# Patient Record
Sex: Female | Born: 1960
Health system: Southern US, Community
[De-identification: ages and names within clinical notes are randomized; demographics above are authoritative.]

## PROBLEM LIST (undated history)

## (undated) DIAGNOSIS — Z8742 Personal history of other diseases of the female genital tract: Secondary | ICD-10-CM

## (undated) DIAGNOSIS — E538 Deficiency of other specified B group vitamins: Secondary | ICD-10-CM

## (undated) DIAGNOSIS — Z87442 Personal history of urinary calculi: Secondary | ICD-10-CM

## (undated) DIAGNOSIS — N200 Calculus of kidney: Secondary | ICD-10-CM

## (undated) HISTORY — PX: LEEP: SHX91

## (undated) HISTORY — DX: Calculus of kidney: N20.0

---

## 1997-11-10 ENCOUNTER — Other Ambulatory Visit: Admission: RE | Admit: 1997-11-10 | Discharge: 1997-11-10 | Payer: Self-pay | Admitting: Obstetrics and Gynecology

## 1998-06-19 HISTORY — PX: BREAST BIOPSY: SHX20

## 1998-11-05 ENCOUNTER — Other Ambulatory Visit: Admission: RE | Admit: 1998-11-05 | Discharge: 1998-11-05 | Payer: Self-pay | Admitting: Obstetrics and Gynecology

## 1999-11-22 ENCOUNTER — Other Ambulatory Visit: Admission: RE | Admit: 1999-11-22 | Discharge: 1999-11-22 | Payer: Self-pay | Admitting: Obstetrics and Gynecology

## 2000-11-12 ENCOUNTER — Other Ambulatory Visit: Admission: RE | Admit: 2000-11-12 | Discharge: 2000-11-12 | Payer: Self-pay | Admitting: Obstetrics and Gynecology

## 2001-11-28 ENCOUNTER — Other Ambulatory Visit: Admission: RE | Admit: 2001-11-28 | Discharge: 2001-11-28 | Payer: Self-pay | Admitting: Obstetrics and Gynecology

## 2004-12-14 ENCOUNTER — Ambulatory Visit: Payer: Self-pay | Admitting: Emergency Medicine

## 2005-02-15 ENCOUNTER — Ambulatory Visit: Payer: Self-pay | Admitting: Unknown Physician Specialty

## 2005-02-28 ENCOUNTER — Ambulatory Visit: Payer: Self-pay | Admitting: Unknown Physician Specialty

## 2005-08-23 ENCOUNTER — Ambulatory Visit: Payer: Self-pay | Admitting: General Surgery

## 2005-09-29 ENCOUNTER — Ambulatory Visit: Payer: Self-pay | Admitting: Urology

## 2006-08-28 ENCOUNTER — Ambulatory Visit: Payer: Self-pay | Admitting: Unknown Physician Specialty

## 2007-08-29 ENCOUNTER — Ambulatory Visit: Payer: Self-pay | Admitting: Unknown Physician Specialty

## 2008-09-11 ENCOUNTER — Ambulatory Visit: Payer: Self-pay | Admitting: Unknown Physician Specialty

## 2008-09-16 ENCOUNTER — Ambulatory Visit: Payer: Self-pay | Admitting: Unknown Physician Specialty

## 2009-09-16 ENCOUNTER — Ambulatory Visit: Payer: Self-pay | Admitting: Unknown Physician Specialty

## 2010-09-26 ENCOUNTER — Ambulatory Visit: Payer: Self-pay | Admitting: Unknown Physician Specialty

## 2011-06-27 ENCOUNTER — Ambulatory Visit: Payer: Self-pay | Admitting: Emergency Medicine

## 2011-06-27 ENCOUNTER — Other Ambulatory Visit: Payer: Self-pay | Admitting: Unknown Physician Specialty

## 2011-07-01 LAB — WOUND AEROBIC CULTURE

## 2011-10-09 ENCOUNTER — Ambulatory Visit: Payer: Self-pay | Admitting: Obstetrics and Gynecology

## 2011-10-16 ENCOUNTER — Ambulatory Visit: Payer: Self-pay | Admitting: Obstetrics and Gynecology

## 2012-02-26 ENCOUNTER — Ambulatory Visit: Payer: Self-pay | Admitting: General Practice

## 2012-02-26 LAB — IRON AND TIBC
Iron Saturation: 12 %
Iron: 47 ug/dL — ABNORMAL LOW (ref 50–170)

## 2012-02-26 LAB — CBC
HGB: 11.7 g/dL — ABNORMAL LOW (ref 12.0–16.0)
MCH: 29.9 pg (ref 26.0–34.0)
MCHC: 33 g/dL (ref 32.0–36.0)
Platelet: 304 10*3/uL (ref 150–440)
RDW: 13.5 % (ref 11.5–14.5)
WBC: 6.2 10*3/uL (ref 3.6–11.0)

## 2012-04-15 ENCOUNTER — Ambulatory Visit: Payer: Self-pay | Admitting: Obstetrics and Gynecology

## 2012-10-17 ENCOUNTER — Other Ambulatory Visit: Payer: Self-pay | Admitting: General Practice

## 2012-10-17 ENCOUNTER — Ambulatory Visit: Payer: Self-pay | Admitting: Obstetrics and Gynecology

## 2012-10-17 LAB — CBC WITH DIFFERENTIAL/PLATELET
Basophil #: 0.1 10*3/uL (ref 0.0–0.1)
Basophil %: 1.4 %
Eosinophil #: 0.1 10*3/uL (ref 0.0–0.7)
Eosinophil %: 2.3 %
HCT: 35.8 % (ref 35.0–47.0)
Lymphocyte #: 2.3 10*3/uL (ref 1.0–3.6)
MCHC: 34.1 g/dL (ref 32.0–36.0)
MCV: 89 fL (ref 80–100)
Monocyte #: 0.7 x10 3/mm (ref 0.2–0.9)
Neutrophil %: 46.8 %

## 2012-10-17 LAB — IRON AND TIBC
Iron: 53 ug/dL (ref 50–170)
Unbound Iron-Bind.Cap.: 360 ug/dL

## 2013-10-23 ENCOUNTER — Ambulatory Visit: Payer: Self-pay | Admitting: General Practice

## 2014-09-23 ENCOUNTER — Encounter: Payer: Self-pay | Admitting: *Deleted

## 2014-10-02 ENCOUNTER — Other Ambulatory Visit: Payer: Self-pay | Admitting: Internal Medicine

## 2014-10-02 DIAGNOSIS — Z1231 Encounter for screening mammogram for malignant neoplasm of breast: Secondary | ICD-10-CM

## 2014-10-26 ENCOUNTER — Ambulatory Visit
Admission: RE | Admit: 2014-10-26 | Discharge: 2014-10-26 | Disposition: A | Discharge status: Home / Self Care | Payer: 59 | Source: Ambulatory Visit | Attending: Internal Medicine | Admitting: Internal Medicine

## 2014-10-26 ENCOUNTER — Other Ambulatory Visit: Payer: Self-pay | Admitting: Internal Medicine

## 2014-10-26 DIAGNOSIS — Z1231 Encounter for screening mammogram for malignant neoplasm of breast: Secondary | ICD-10-CM

## 2015-07-21 DIAGNOSIS — R000 Tachycardia, unspecified: Secondary | ICD-10-CM | POA: Diagnosis not present

## 2015-07-21 DIAGNOSIS — E538 Deficiency of other specified B group vitamins: Secondary | ICD-10-CM | POA: Diagnosis not present

## 2015-07-28 DIAGNOSIS — Z1211 Encounter for screening for malignant neoplasm of colon: Secondary | ICD-10-CM | POA: Diagnosis not present

## 2015-07-28 DIAGNOSIS — R000 Tachycardia, unspecified: Secondary | ICD-10-CM | POA: Diagnosis not present

## 2015-07-28 DIAGNOSIS — N200 Calculus of kidney: Secondary | ICD-10-CM | POA: Diagnosis not present

## 2015-07-28 DIAGNOSIS — D519 Vitamin B12 deficiency anemia, unspecified: Secondary | ICD-10-CM | POA: Diagnosis not present

## 2015-09-16 ENCOUNTER — Other Ambulatory Visit: Payer: Self-pay | Admitting: Internal Medicine

## 2015-09-16 DIAGNOSIS — Z1231 Encounter for screening mammogram for malignant neoplasm of breast: Secondary | ICD-10-CM

## 2015-10-27 ENCOUNTER — Other Ambulatory Visit: Payer: Self-pay | Admitting: Internal Medicine

## 2015-10-27 ENCOUNTER — Ambulatory Visit
Admission: RE | Admit: 2015-10-27 | Discharge: 2015-10-27 | Disposition: A | Discharge status: Home / Self Care | Payer: 59 | Source: Ambulatory Visit | Attending: Internal Medicine | Admitting: Internal Medicine

## 2015-10-27 DIAGNOSIS — Z1231 Encounter for screening mammogram for malignant neoplasm of breast: Secondary | ICD-10-CM | POA: Diagnosis not present

## 2015-12-22 ENCOUNTER — Encounter: Payer: Self-pay | Admitting: Physician Assistant

## 2015-12-22 ENCOUNTER — Ambulatory Visit: Payer: Self-pay | Admitting: Physician Assistant

## 2015-12-22 VITALS — BP 94/60 | HR 60 | Temp 98.7°F

## 2015-12-22 DIAGNOSIS — R300 Dysuria: Secondary | ICD-10-CM

## 2015-12-22 DIAGNOSIS — N390 Urinary tract infection, site not specified: Secondary | ICD-10-CM

## 2015-12-22 DIAGNOSIS — R319 Hematuria, unspecified: Secondary | ICD-10-CM

## 2015-12-22 MED ORDER — CIPROFLOXACIN HCL 500 MG PO TABS: 500.0000 mg | ORAL_TABLET | Freq: Two times a day (BID) | ORAL | Status: DC

## 2015-12-22 MED ORDER — PHENAZOPYRIDINE HCL 200 MG PO TABS: 200.0000 mg | ORAL_TABLET | Freq: Three times a day (TID) | ORAL | Status: DC | PRN

## 2015-12-22 NOTE — Progress Notes (Signed)
S:  C/o uti sx for 2 days, pressure, urgency, frequency, denies vaginal discharge, abdominal pain or flank pain:  Remainder ros neg, sx started on Mon night, had cipro left from travel meds, took 2 pills  O:  Vitals wnl, nad, neuro intact, ua trace blood  A: uti  P: cipro 500mg  bid x 3d, pyridium 200mg  , increase water intake, add cranberry juice, return if not improving in 2 -3 days, return earlier if worsening, discussed pyelonephritis sx

## 2015-12-27 ENCOUNTER — Ambulatory Visit: Payer: Self-pay | Admitting: Physician Assistant

## 2015-12-28 ENCOUNTER — Ambulatory Visit: Payer: Self-pay | Admitting: Physician Assistant

## 2015-12-28 VITALS — BP 110/70 | HR 60 | Temp 97.7°F

## 2015-12-28 DIAGNOSIS — F5102 Adjustment insomnia: Secondary | ICD-10-CM

## 2015-12-28 DIAGNOSIS — F411 Generalized anxiety disorder: Secondary | ICD-10-CM

## 2015-12-28 DIAGNOSIS — T733XXA Exhaustion due to excessive exertion, initial encounter: Secondary | ICD-10-CM

## 2015-12-28 NOTE — Progress Notes (Signed)
S:  Insomnia, fatigue, stress, emotional, crying,  inability to focus due to personal stress of recent mother and husband's illness.   O: Lungs cl, Heart RR A: Insomnia, anxiety, fatigue P: [employer] papers filled out today for continuous time 4-6 weeks.  To be determined if more time is needed.

## 2016-01-04 ENCOUNTER — Ambulatory Visit: Payer: Self-pay | Admitting: Physician Assistant

## 2016-01-04 DIAGNOSIS — F5102 Adjustment insomnia: Secondary | ICD-10-CM

## 2016-01-04 NOTE — Progress Notes (Signed)
S: needs short term disability papers filled out O: no change A: insomnia due to stress, fatigue P: Papers filled out and faxed.  Encouraged to see EPA.

## 2016-01-11 ENCOUNTER — Encounter: Payer: Self-pay | Admitting: Physician Assistant

## 2016-01-11 ENCOUNTER — Ambulatory Visit: Payer: Self-pay | Admitting: Physician Assistant

## 2016-01-11 VITALS — BP 120/66 | Temp 97.6°F

## 2016-01-11 DIAGNOSIS — F4321 Adjustment disorder with depressed mood: Secondary | ICD-10-CM

## 2016-01-11 DIAGNOSIS — F5102 Adjustment insomnia: Secondary | ICD-10-CM

## 2016-01-11 NOTE — Progress Notes (Signed)
S: here for recheck of anxiety, insomnia and fatigue due to stress of husband's sudden illness and death, she has lost 6 lbs, unable to eat, crying a lot at home, overwhelmed by paper work and events of his sudden death, also her mother is once again having to go through radiation for breast cancer, worried about her, states she is taking "extra large" doses of benadryl to help her sleep, doesn't want to go on medications as she hopes this will be short term situation, no si/hi  O: vitals wnl, nad, tearful, anxious during conversation, neuro intact  A: situational depression, anxiety, insomnia  P: pt to continue benadryl as this is helping with her sleep, if stops working trial of melatonin, encouraged her to see EAP, stay involved with family/friends, ask for help to decrease feelings of being overwhelmed, recommend she stay out of work for at least another week to see if the symptoms decrease by getting back into a regular sleep pattern and schedule, 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

## 2016-01-31 ENCOUNTER — Ambulatory Visit: Payer: Self-pay | Admitting: Physician Assistant

## 2016-02-16 DIAGNOSIS — H5203 Hypermetropia, bilateral: Secondary | ICD-10-CM | POA: Diagnosis not present

## 2016-02-16 DIAGNOSIS — H52223 Regular astigmatism, bilateral: Secondary | ICD-10-CM | POA: Diagnosis not present

## 2016-02-16 DIAGNOSIS — H524 Presbyopia: Secondary | ICD-10-CM | POA: Diagnosis not present

## 2016-06-19 HISTORY — PX: BREAST EXCISIONAL BIOPSY: SUR124

## 2016-08-10 ENCOUNTER — Other Ambulatory Visit: Payer: Self-pay | Admitting: Obstetrics and Gynecology

## 2016-08-10 DIAGNOSIS — Z1231 Encounter for screening mammogram for malignant neoplasm of breast: Secondary | ICD-10-CM | POA: Diagnosis not present

## 2016-08-10 DIAGNOSIS — Z01419 Encounter for gynecological examination (general) (routine) without abnormal findings: Secondary | ICD-10-CM | POA: Diagnosis not present

## 2016-08-28 DIAGNOSIS — D519 Vitamin B12 deficiency anemia, unspecified: Secondary | ICD-10-CM | POA: Diagnosis not present

## 2016-08-28 DIAGNOSIS — Z0000 Encounter for general adult medical examination without abnormal findings: Secondary | ICD-10-CM | POA: Diagnosis not present

## 2016-09-04 DIAGNOSIS — D225 Melanocytic nevi of trunk: Secondary | ICD-10-CM | POA: Diagnosis not present

## 2016-09-04 DIAGNOSIS — L851 Acquired keratosis [keratoderma] palmaris et plantaris: Secondary | ICD-10-CM | POA: Diagnosis not present

## 2016-09-04 DIAGNOSIS — D2272 Melanocytic nevi of left lower limb, including hip: Secondary | ICD-10-CM | POA: Diagnosis not present

## 2016-09-04 DIAGNOSIS — D2261 Melanocytic nevi of right upper limb, including shoulder: Secondary | ICD-10-CM | POA: Diagnosis not present

## 2016-09-11 ENCOUNTER — Other Ambulatory Visit: Payer: Self-pay | Admitting: Internal Medicine

## 2016-09-11 DIAGNOSIS — R000 Tachycardia, unspecified: Secondary | ICD-10-CM | POA: Diagnosis not present

## 2016-09-11 DIAGNOSIS — R3129 Other microscopic hematuria: Secondary | ICD-10-CM | POA: Diagnosis not present

## 2016-09-11 DIAGNOSIS — D519 Vitamin B12 deficiency anemia, unspecified: Secondary | ICD-10-CM | POA: Diagnosis not present

## 2016-09-11 DIAGNOSIS — Z1231 Encounter for screening mammogram for malignant neoplasm of breast: Secondary | ICD-10-CM | POA: Diagnosis not present

## 2016-09-11 DIAGNOSIS — N200 Calculus of kidney: Secondary | ICD-10-CM | POA: Diagnosis not present

## 2016-09-14 DIAGNOSIS — Z1212 Encounter for screening for malignant neoplasm of rectum: Secondary | ICD-10-CM | POA: Diagnosis not present

## 2016-09-14 DIAGNOSIS — Z1211 Encounter for screening for malignant neoplasm of colon: Secondary | ICD-10-CM | POA: Diagnosis not present

## 2016-10-25 ENCOUNTER — Ambulatory Visit: Payer: Self-pay | Admitting: Physician Assistant

## 2016-10-25 ENCOUNTER — Encounter: Payer: Self-pay | Admitting: Physician Assistant

## 2016-10-25 VITALS — BP 110/70 | HR 60 | Temp 98.5°F

## 2016-10-25 DIAGNOSIS — M62838 Other muscle spasm: Secondary | ICD-10-CM

## 2016-10-25 MED ORDER — BACLOFEN 10 MG PO TABS: 10.0000 mg | ORAL_TABLET | Freq: Three times a day (TID) | ORAL | 0 refills | Status: DC

## 2016-10-25 MED ORDER — MELOXICAM 15 MG PO TABS: 15.0000 mg | ORAL_TABLET | Freq: Every day | ORAL | 0 refills | Status: DC

## 2016-10-25 NOTE — Progress Notes (Signed)
S: c/o r shoulder and neck pain, increased pain with movement, no numbness or tingling, been taking ibuprofen and aleve without relief, no known injury, just feels like she slept wrong  O: vitals wnl, nad, skin intact, cspine nontender, full rom, r trapezious and supraspinatus muscles spasmed, carotids neg for bruits, lungs c t a, cv rrr, n/v intact  A: muscle spasm  P: baclofen, mobic

## 2016-10-30 ENCOUNTER — Ambulatory Visit
Admission: RE | Admit: 2016-10-30 | Discharge: 2016-10-30 | Disposition: A | Discharge status: Home / Self Care | Payer: 59 | Source: Ambulatory Visit | Attending: Obstetrics and Gynecology | Admitting: Obstetrics and Gynecology

## 2016-10-30 DIAGNOSIS — Z1231 Encounter for screening mammogram for malignant neoplasm of breast: Secondary | ICD-10-CM | POA: Diagnosis not present

## 2016-11-07 ENCOUNTER — Other Ambulatory Visit: Payer: Self-pay | Admitting: Obstetrics and Gynecology

## 2016-11-07 DIAGNOSIS — R921 Mammographic calcification found on diagnostic imaging of breast: Secondary | ICD-10-CM

## 2016-11-07 DIAGNOSIS — R928 Other abnormal and inconclusive findings on diagnostic imaging of breast: Secondary | ICD-10-CM

## 2016-11-21 ENCOUNTER — Ambulatory Visit: Payer: 59

## 2016-12-01 ENCOUNTER — Ambulatory Visit
Admission: RE | Admit: 2016-12-01 | Discharge: 2016-12-01 | Disposition: A | Discharge status: Home / Self Care | Payer: 59 | Source: Ambulatory Visit | Attending: Obstetrics and Gynecology | Admitting: Obstetrics and Gynecology

## 2016-12-01 ENCOUNTER — Telehealth: Payer: Self-pay

## 2016-12-01 ENCOUNTER — Other Ambulatory Visit: Payer: Self-pay

## 2016-12-01 ENCOUNTER — Other Ambulatory Visit: Payer: Self-pay | Admitting: General Surgery

## 2016-12-01 DIAGNOSIS — R921 Mammographic calcification found on diagnostic imaging of breast: Secondary | ICD-10-CM

## 2016-12-01 DIAGNOSIS — R928 Other abnormal and inconclusive findings on diagnostic imaging of breast: Secondary | ICD-10-CM | POA: Diagnosis not present

## 2016-12-01 NOTE — Telephone Encounter (Signed)
Call to patient to discuss stereo biopsy. Patient has been scheduled for a left breast stereotactic biopsy at [facility] for 12/04/16 at 1:00 pm. She will check-in at the [facility] at 12:30 pm. This patient is aware of date, time, and instructions. Patient verbalizes understanding. She will follow up with a nurse visit here on 12/01/16 at 10:00 am.

## 2016-12-04 ENCOUNTER — Ambulatory Visit
Admission: RE | Admit: 2016-12-04 | Discharge: 2016-12-04 | Disposition: A | Discharge status: Home / Self Care | Payer: 59 | Source: Ambulatory Visit | Attending: General Surgery | Admitting: General Surgery

## 2016-12-04 DIAGNOSIS — R921 Mammographic calcification found on diagnostic imaging of breast: Secondary | ICD-10-CM | POA: Diagnosis not present

## 2016-12-04 DIAGNOSIS — R920 Mammographic microcalcification found on diagnostic imaging of breast: Secondary | ICD-10-CM | POA: Diagnosis not present

## 2016-12-05 LAB — SURGICAL PATHOLOGY

## 2016-12-11 ENCOUNTER — Ambulatory Visit (INDEPENDENT_AMBULATORY_CARE_PROVIDER_SITE_OTHER): Payer: 59 | Admitting: *Deleted

## 2016-12-11 DIAGNOSIS — N6320 Unspecified lump in the left breast, unspecified quadrant: Secondary | ICD-10-CM

## 2016-12-11 NOTE — Progress Notes (Signed)
Patient here today for follow up post left breast biopsy.  Steristrip in place and aware it may come off in one week. No bruising noticed. The patient is aware that a heating pad may be used for comfort as needed.  Follow up as scheduled.

## 2016-12-11 NOTE — Patient Instructions (Signed)
Follow up appointment to be announced.  

## 2017-02-21 DIAGNOSIS — H524 Presbyopia: Secondary | ICD-10-CM | POA: Diagnosis not present

## 2017-02-21 DIAGNOSIS — H52223 Regular astigmatism, bilateral: Secondary | ICD-10-CM | POA: Diagnosis not present

## 2017-02-21 DIAGNOSIS — H5203 Hypermetropia, bilateral: Secondary | ICD-10-CM | POA: Diagnosis not present

## 2017-05-24 DIAGNOSIS — N930 Postcoital and contact bleeding: Secondary | ICD-10-CM | POA: Diagnosis not present

## 2017-05-24 DIAGNOSIS — N950 Postmenopausal bleeding: Secondary | ICD-10-CM | POA: Diagnosis not present

## 2017-08-14 DIAGNOSIS — Z01419 Encounter for gynecological examination (general) (routine) without abnormal findings: Secondary | ICD-10-CM | POA: Diagnosis not present

## 2017-09-06 DIAGNOSIS — D519 Vitamin B12 deficiency anemia, unspecified: Secondary | ICD-10-CM | POA: Diagnosis not present

## 2017-09-06 DIAGNOSIS — Z0000 Encounter for general adult medical examination without abnormal findings: Secondary | ICD-10-CM | POA: Diagnosis not present

## 2017-09-06 DIAGNOSIS — R3129 Other microscopic hematuria: Secondary | ICD-10-CM | POA: Diagnosis not present

## 2017-09-27 ENCOUNTER — Other Ambulatory Visit: Payer: Self-pay | Admitting: Internal Medicine

## 2017-09-27 DIAGNOSIS — R002 Palpitations: Secondary | ICD-10-CM | POA: Diagnosis not present

## 2017-09-27 DIAGNOSIS — D519 Vitamin B12 deficiency anemia, unspecified: Secondary | ICD-10-CM | POA: Diagnosis not present

## 2017-09-27 DIAGNOSIS — Z0000 Encounter for general adult medical examination without abnormal findings: Secondary | ICD-10-CM | POA: Diagnosis not present

## 2017-09-27 DIAGNOSIS — Z1231 Encounter for screening mammogram for malignant neoplasm of breast: Secondary | ICD-10-CM

## 2017-10-31 ENCOUNTER — Encounter: Payer: Self-pay | Admitting: Radiology

## 2017-10-31 ENCOUNTER — Ambulatory Visit
Admission: RE | Admit: 2017-10-31 | Discharge: 2017-10-31 | Disposition: A | Discharge status: Home / Self Care | Payer: 59 | Source: Ambulatory Visit | Attending: Internal Medicine | Admitting: Internal Medicine

## 2017-10-31 DIAGNOSIS — Z1231 Encounter for screening mammogram for malignant neoplasm of breast: Secondary | ICD-10-CM | POA: Diagnosis not present

## 2018-02-25 DIAGNOSIS — H52223 Regular astigmatism, bilateral: Secondary | ICD-10-CM | POA: Diagnosis not present

## 2018-02-25 DIAGNOSIS — H524 Presbyopia: Secondary | ICD-10-CM | POA: Diagnosis not present

## 2018-02-25 DIAGNOSIS — H5203 Hypermetropia, bilateral: Secondary | ICD-10-CM | POA: Diagnosis not present

## 2018-07-05 DIAGNOSIS — R55 Syncope and collapse: Secondary | ICD-10-CM | POA: Diagnosis not present

## 2018-07-05 DIAGNOSIS — R238 Other skin changes: Secondary | ICD-10-CM | POA: Diagnosis not present

## 2018-07-05 DIAGNOSIS — I959 Hypotension, unspecified: Secondary | ICD-10-CM | POA: Diagnosis not present

## 2018-07-16 DIAGNOSIS — R55 Syncope and collapse: Secondary | ICD-10-CM | POA: Diagnosis not present

## 2018-07-17 DIAGNOSIS — R748 Abnormal levels of other serum enzymes: Secondary | ICD-10-CM | POA: Diagnosis not present

## 2018-07-17 DIAGNOSIS — R55 Syncope and collapse: Secondary | ICD-10-CM | POA: Diagnosis not present

## 2018-07-17 DIAGNOSIS — I959 Hypotension, unspecified: Secondary | ICD-10-CM | POA: Diagnosis not present

## 2018-07-18 DIAGNOSIS — N23 Unspecified renal colic: Secondary | ICD-10-CM | POA: Diagnosis not present

## 2018-07-18 DIAGNOSIS — I959 Hypotension, unspecified: Secondary | ICD-10-CM | POA: Diagnosis not present

## 2018-07-18 DIAGNOSIS — R748 Abnormal levels of other serum enzymes: Secondary | ICD-10-CM | POA: Diagnosis not present

## 2018-07-18 DIAGNOSIS — R002 Palpitations: Secondary | ICD-10-CM | POA: Diagnosis not present

## 2018-07-18 DIAGNOSIS — R55 Syncope and collapse: Secondary | ICD-10-CM | POA: Diagnosis not present

## 2018-07-30 DIAGNOSIS — C44519 Basal cell carcinoma of skin of other part of trunk: Secondary | ICD-10-CM | POA: Diagnosis not present

## 2018-07-30 DIAGNOSIS — D2272 Melanocytic nevi of left lower limb, including hip: Secondary | ICD-10-CM | POA: Diagnosis not present

## 2018-07-30 DIAGNOSIS — D2271 Melanocytic nevi of right lower limb, including hip: Secondary | ICD-10-CM | POA: Diagnosis not present

## 2018-07-30 DIAGNOSIS — D485 Neoplasm of uncertain behavior of skin: Secondary | ICD-10-CM | POA: Diagnosis not present

## 2018-07-30 DIAGNOSIS — D225 Melanocytic nevi of trunk: Secondary | ICD-10-CM | POA: Diagnosis not present

## 2018-07-30 DIAGNOSIS — D2261 Melanocytic nevi of right upper limb, including shoulder: Secondary | ICD-10-CM | POA: Diagnosis not present

## 2018-07-30 DIAGNOSIS — D2262 Melanocytic nevi of left upper limb, including shoulder: Secondary | ICD-10-CM | POA: Diagnosis not present

## 2018-08-19 DIAGNOSIS — C44519 Basal cell carcinoma of skin of other part of trunk: Secondary | ICD-10-CM | POA: Diagnosis not present

## 2018-08-20 ENCOUNTER — Other Ambulatory Visit: Payer: Self-pay | Admitting: Obstetrics and Gynecology

## 2018-08-20 DIAGNOSIS — Z124 Encounter for screening for malignant neoplasm of cervix: Secondary | ICD-10-CM | POA: Diagnosis not present

## 2018-08-20 DIAGNOSIS — Z01419 Encounter for gynecological examination (general) (routine) without abnormal findings: Secondary | ICD-10-CM | POA: Diagnosis not present

## 2018-08-20 DIAGNOSIS — Z1231 Encounter for screening mammogram for malignant neoplasm of breast: Secondary | ICD-10-CM

## 2018-08-20 DIAGNOSIS — Z1151 Encounter for screening for human papillomavirus (HPV): Secondary | ICD-10-CM | POA: Diagnosis not present

## 2018-08-20 DIAGNOSIS — Z1239 Encounter for other screening for malignant neoplasm of breast: Secondary | ICD-10-CM | POA: Diagnosis not present

## 2018-09-30 DIAGNOSIS — D519 Vitamin B12 deficiency anemia, unspecified: Secondary | ICD-10-CM | POA: Diagnosis not present

## 2018-09-30 DIAGNOSIS — Z114 Encounter for screening for human immunodeficiency virus [HIV]: Secondary | ICD-10-CM | POA: Diagnosis not present

## 2018-09-30 DIAGNOSIS — Z0000 Encounter for general adult medical examination without abnormal findings: Secondary | ICD-10-CM | POA: Diagnosis not present

## 2018-10-03 DIAGNOSIS — Z0000 Encounter for general adult medical examination without abnormal findings: Secondary | ICD-10-CM | POA: Diagnosis not present

## 2018-10-03 DIAGNOSIS — D519 Vitamin B12 deficiency anemia, unspecified: Secondary | ICD-10-CM | POA: Diagnosis not present

## 2018-10-03 DIAGNOSIS — R748 Abnormal levels of other serum enzymes: Secondary | ICD-10-CM | POA: Diagnosis not present

## 2018-10-03 DIAGNOSIS — R002 Palpitations: Secondary | ICD-10-CM | POA: Diagnosis not present

## 2018-10-03 DIAGNOSIS — N200 Calculus of kidney: Secondary | ICD-10-CM | POA: Diagnosis not present

## 2018-10-14 DIAGNOSIS — Z1211 Encounter for screening for malignant neoplasm of colon: Secondary | ICD-10-CM | POA: Diagnosis not present

## 2018-10-14 DIAGNOSIS — Z1212 Encounter for screening for malignant neoplasm of rectum: Secondary | ICD-10-CM | POA: Diagnosis not present

## 2018-10-21 ENCOUNTER — Other Ambulatory Visit: Payer: Self-pay | Admitting: Internal Medicine

## 2018-10-21 DIAGNOSIS — D519 Vitamin B12 deficiency anemia, unspecified: Secondary | ICD-10-CM | POA: Diagnosis not present

## 2018-10-21 DIAGNOSIS — E7800 Pure hypercholesterolemia, unspecified: Secondary | ICD-10-CM | POA: Diagnosis not present

## 2018-10-21 DIAGNOSIS — R7989 Other specified abnormal findings of blood chemistry: Secondary | ICD-10-CM

## 2018-10-21 DIAGNOSIS — R945 Abnormal results of liver function studies: Principal | ICD-10-CM

## 2018-10-21 DIAGNOSIS — N200 Calculus of kidney: Secondary | ICD-10-CM | POA: Diagnosis not present

## 2018-10-22 ENCOUNTER — Ambulatory Visit: Payer: 59

## 2018-10-22 ENCOUNTER — Other Ambulatory Visit: Payer: Self-pay

## 2018-10-22 ENCOUNTER — Ambulatory Visit
Admission: RE | Admit: 2018-10-22 | Discharge: 2018-10-22 | Disposition: A | Discharge status: Home / Self Care | Payer: 59 | Source: Ambulatory Visit | Attending: Internal Medicine | Admitting: Internal Medicine

## 2018-10-22 DIAGNOSIS — K824 Cholesterolosis of gallbladder: Secondary | ICD-10-CM | POA: Diagnosis not present

## 2018-10-22 DIAGNOSIS — R7989 Other specified abnormal findings of blood chemistry: Secondary | ICD-10-CM

## 2018-10-22 DIAGNOSIS — R945 Abnormal results of liver function studies: Secondary | ICD-10-CM | POA: Insufficient documentation

## 2018-10-22 DIAGNOSIS — K828 Other specified diseases of gallbladder: Secondary | ICD-10-CM | POA: Diagnosis not present

## 2018-10-25 ENCOUNTER — Ambulatory Visit: Payer: 59

## 2018-10-25 ENCOUNTER — Other Ambulatory Visit: Payer: Self-pay | Admitting: Family Medicine

## 2018-10-25 ENCOUNTER — Ambulatory Visit
Admission: RE | Admit: 2018-10-25 | Discharge: 2018-10-25 | Disposition: A | Discharge status: Home / Self Care | Payer: 59 | Attending: Family Medicine | Admitting: Family Medicine

## 2018-10-25 ENCOUNTER — Other Ambulatory Visit: Payer: Self-pay

## 2018-10-25 ENCOUNTER — Ambulatory Visit
Admission: RE | Admit: 2018-10-25 | Discharge: 2018-10-25 | Disposition: A | Discharge status: Home / Self Care | Payer: PRIVATE HEALTH INSURANCE | Source: Ambulatory Visit | Attending: Family Medicine | Admitting: Family Medicine

## 2018-10-25 DIAGNOSIS — M25531 Pain in right wrist: Secondary | ICD-10-CM

## 2018-10-25 DIAGNOSIS — M79641 Pain in right hand: Secondary | ICD-10-CM | POA: Diagnosis not present

## 2018-10-29 DIAGNOSIS — R740 Nonspecific elevation of levels of transaminase and lactic acid dehydrogenase [LDH]: Secondary | ICD-10-CM | POA: Diagnosis not present

## 2018-10-29 DIAGNOSIS — D519 Vitamin B12 deficiency anemia, unspecified: Secondary | ICD-10-CM | POA: Diagnosis not present

## 2018-11-28 ENCOUNTER — Other Ambulatory Visit: Payer: Self-pay

## 2018-11-28 ENCOUNTER — Ambulatory Visit
Admission: RE | Admit: 2018-11-28 | Discharge: 2018-11-28 | Disposition: A | Discharge status: Home / Self Care | Payer: 59 | Source: Ambulatory Visit | Attending: Obstetrics and Gynecology | Admitting: Obstetrics and Gynecology

## 2018-11-28 DIAGNOSIS — Z1231 Encounter for screening mammogram for malignant neoplasm of breast: Secondary | ICD-10-CM | POA: Insufficient documentation

## 2018-12-03 ENCOUNTER — Other Ambulatory Visit: Payer: Self-pay | Admitting: Obstetrics and Gynecology

## 2018-12-03 DIAGNOSIS — R928 Other abnormal and inconclusive findings on diagnostic imaging of breast: Secondary | ICD-10-CM

## 2018-12-03 DIAGNOSIS — N6489 Other specified disorders of breast: Secondary | ICD-10-CM

## 2018-12-09 ENCOUNTER — Ambulatory Visit
Admission: RE | Admit: 2018-12-09 | Discharge: 2018-12-09 | Disposition: A | Discharge status: Home / Self Care | Payer: 59 | Source: Ambulatory Visit | Attending: Obstetrics and Gynecology | Admitting: Obstetrics and Gynecology

## 2018-12-09 ENCOUNTER — Other Ambulatory Visit: Payer: Self-pay

## 2018-12-09 DIAGNOSIS — R928 Other abnormal and inconclusive findings on diagnostic imaging of breast: Secondary | ICD-10-CM | POA: Diagnosis not present

## 2018-12-09 DIAGNOSIS — N6489 Other specified disorders of breast: Secondary | ICD-10-CM

## 2018-12-09 DIAGNOSIS — R922 Inconclusive mammogram: Secondary | ICD-10-CM | POA: Diagnosis not present

## 2018-12-12 DIAGNOSIS — R740 Nonspecific elevation of levels of transaminase and lactic acid dehydrogenase [LDH]: Secondary | ICD-10-CM | POA: Diagnosis not present

## 2018-12-17 ENCOUNTER — Ambulatory Visit: Payer: 59

## 2018-12-24 DIAGNOSIS — L538 Other specified erythematous conditions: Secondary | ICD-10-CM | POA: Diagnosis not present

## 2018-12-24 DIAGNOSIS — R208 Other disturbances of skin sensation: Secondary | ICD-10-CM | POA: Diagnosis not present

## 2018-12-24 DIAGNOSIS — D2262 Melanocytic nevi of left upper limb, including shoulder: Secondary | ICD-10-CM | POA: Diagnosis not present

## 2018-12-24 DIAGNOSIS — Z85828 Personal history of other malignant neoplasm of skin: Secondary | ICD-10-CM | POA: Diagnosis not present

## 2018-12-24 DIAGNOSIS — Z08 Encounter for follow-up examination after completed treatment for malignant neoplasm: Secondary | ICD-10-CM | POA: Diagnosis not present

## 2018-12-24 DIAGNOSIS — D225 Melanocytic nevi of trunk: Secondary | ICD-10-CM | POA: Diagnosis not present

## 2018-12-24 DIAGNOSIS — D2272 Melanocytic nevi of left lower limb, including hip: Secondary | ICD-10-CM | POA: Diagnosis not present

## 2018-12-24 DIAGNOSIS — D2261 Melanocytic nevi of right upper limb, including shoulder: Secondary | ICD-10-CM | POA: Diagnosis not present

## 2018-12-24 DIAGNOSIS — B078 Other viral warts: Secondary | ICD-10-CM | POA: Diagnosis not present

## 2018-12-24 DIAGNOSIS — R58 Hemorrhage, not elsewhere classified: Secondary | ICD-10-CM | POA: Diagnosis not present

## 2019-02-27 DIAGNOSIS — H52223 Regular astigmatism, bilateral: Secondary | ICD-10-CM | POA: Diagnosis not present

## 2019-02-27 DIAGNOSIS — H5203 Hypermetropia, bilateral: Secondary | ICD-10-CM | POA: Diagnosis not present

## 2019-02-27 DIAGNOSIS — H524 Presbyopia: Secondary | ICD-10-CM | POA: Diagnosis not present

## 2019-05-29 DIAGNOSIS — L820 Inflamed seborrheic keratosis: Secondary | ICD-10-CM | POA: Diagnosis not present

## 2019-05-29 DIAGNOSIS — L538 Other specified erythematous conditions: Secondary | ICD-10-CM | POA: Diagnosis not present

## 2019-08-21 ENCOUNTER — Other Ambulatory Visit: Payer: Self-pay | Admitting: Obstetrics and Gynecology

## 2019-08-21 DIAGNOSIS — Z1231 Encounter for screening mammogram for malignant neoplasm of breast: Secondary | ICD-10-CM

## 2019-08-21 DIAGNOSIS — Z01419 Encounter for gynecological examination (general) (routine) without abnormal findings: Secondary | ICD-10-CM | POA: Diagnosis not present

## 2019-09-14 ENCOUNTER — Ambulatory Visit: Payer: 59 | Attending: Internal Medicine

## 2019-09-14 DIAGNOSIS — Z23 Encounter for immunization: Secondary | ICD-10-CM

## 2019-09-14 NOTE — Progress Notes (Signed)
   Covid-19 Vaccination Clinic  Name:  Lindsey Sullivan    MRN: [id number] DOB: [birth year]  09/14/2019  [name] was observed post Covid-19 immunization for 15 minutes without incident. She was provided with Vaccine Information Sheet and instruction to access the V-Safe system.   [name] was instructed to call 911 with any severe reactions post vaccine: Marland Kitchen Difficulty breathing  . Swelling of face and throat  . A fast heartbeat  . A bad rash all over body  . Dizziness and weakness   Immunizations Administered    Name Date Dose VIS Date Route   Pfizer COVID-19 Vaccine 09/14/2019  2:46 PM 0.3 mL 05/30/2019 Intramuscular   Manufacturer: [place]   Lot: [id number]   [place]: [id number]

## 2019-09-29 DIAGNOSIS — X32XXXA Exposure to sunlight, initial encounter: Secondary | ICD-10-CM | POA: Diagnosis not present

## 2019-09-29 DIAGNOSIS — Z08 Encounter for follow-up examination after completed treatment for malignant neoplasm: Secondary | ICD-10-CM | POA: Diagnosis not present

## 2019-09-29 DIAGNOSIS — Z85828 Personal history of other malignant neoplasm of skin: Secondary | ICD-10-CM | POA: Diagnosis not present

## 2019-09-29 DIAGNOSIS — D2261 Melanocytic nevi of right upper limb, including shoulder: Secondary | ICD-10-CM | POA: Diagnosis not present

## 2019-09-29 DIAGNOSIS — D2271 Melanocytic nevi of right lower limb, including hip: Secondary | ICD-10-CM | POA: Diagnosis not present

## 2019-09-29 DIAGNOSIS — L814 Other melanin hyperpigmentation: Secondary | ICD-10-CM | POA: Diagnosis not present

## 2019-09-29 DIAGNOSIS — D2262 Melanocytic nevi of left upper limb, including shoulder: Secondary | ICD-10-CM | POA: Diagnosis not present

## 2019-10-01 DIAGNOSIS — D519 Vitamin B12 deficiency anemia, unspecified: Secondary | ICD-10-CM | POA: Diagnosis not present

## 2019-10-01 DIAGNOSIS — Z0000 Encounter for general adult medical examination without abnormal findings: Secondary | ICD-10-CM | POA: Diagnosis not present

## 2019-10-01 DIAGNOSIS — R000 Tachycardia, unspecified: Secondary | ICD-10-CM | POA: Diagnosis not present

## 2019-10-08 ENCOUNTER — Ambulatory Visit: Payer: 59 | Attending: Internal Medicine

## 2019-10-08 DIAGNOSIS — R002 Palpitations: Secondary | ICD-10-CM | POA: Diagnosis not present

## 2019-10-08 DIAGNOSIS — Z0000 Encounter for general adult medical examination without abnormal findings: Secondary | ICD-10-CM | POA: Diagnosis not present

## 2019-10-08 DIAGNOSIS — R000 Tachycardia, unspecified: Secondary | ICD-10-CM | POA: Diagnosis not present

## 2019-10-08 DIAGNOSIS — D519 Vitamin B12 deficiency anemia, unspecified: Secondary | ICD-10-CM | POA: Diagnosis not present

## 2019-10-08 DIAGNOSIS — Z23 Encounter for immunization: Secondary | ICD-10-CM

## 2019-10-08 NOTE — Progress Notes (Signed)
   Covid-19 Vaccination Clinic  Name:  Lindsey Sullivan    MRN: [id number] DOB: 02/10/1961  10/08/2019  [name] was observed post Covid-19 immunization for 15 minutes without incident. She was provided with Vaccine Information Sheet and instruction to access the V-Safe system.   [name] was instructed to call 911 with any severe reactions post vaccine: Marland Kitchen Difficulty breathing  . Swelling of face and throat  . A fast heartbeat  . A bad rash all over body  . Dizziness and weakness   Immunizations Administered    Name Date Dose VIS Date Route   Pfizer COVID-19 Vaccine 10/08/2019 11:15 AM 0.3 mL 08/13/2018 Intramuscular   Manufacturer: [employer]   Lot: [id number]   [place]: [id number]

## 2019-12-01 ENCOUNTER — Ambulatory Visit
Admission: RE | Admit: 2019-12-01 | Discharge: 2019-12-01 | Disposition: A | Discharge status: Home / Self Care | Payer: 59 | Source: Ambulatory Visit | Attending: Obstetrics and Gynecology | Admitting: Obstetrics and Gynecology

## 2019-12-01 DIAGNOSIS — Z1231 Encounter for screening mammogram for malignant neoplasm of breast: Secondary | ICD-10-CM | POA: Insufficient documentation

## 2020-03-02 DIAGNOSIS — H5203 Hypermetropia, bilateral: Secondary | ICD-10-CM | POA: Diagnosis not present

## 2020-03-02 DIAGNOSIS — Z135 Encounter for screening for eye and ear disorders: Secondary | ICD-10-CM | POA: Diagnosis not present

## 2020-03-02 DIAGNOSIS — H524 Presbyopia: Secondary | ICD-10-CM | POA: Diagnosis not present

## 2020-03-02 DIAGNOSIS — H52223 Regular astigmatism, bilateral: Secondary | ICD-10-CM | POA: Diagnosis not present

## 2020-07-20 ENCOUNTER — Other Ambulatory Visit: Payer: Self-pay | Admitting: Physician Assistant

## 2020-09-28 DIAGNOSIS — D2272 Melanocytic nevi of left lower limb, including hip: Secondary | ICD-10-CM | POA: Diagnosis not present

## 2020-09-28 DIAGNOSIS — L570 Actinic keratosis: Secondary | ICD-10-CM | POA: Diagnosis not present

## 2020-09-28 DIAGNOSIS — Z85828 Personal history of other malignant neoplasm of skin: Secondary | ICD-10-CM | POA: Diagnosis not present

## 2020-09-28 DIAGNOSIS — X32XXXA Exposure to sunlight, initial encounter: Secondary | ICD-10-CM | POA: Diagnosis not present

## 2020-09-28 DIAGNOSIS — D2262 Melanocytic nevi of left upper limb, including shoulder: Secondary | ICD-10-CM | POA: Diagnosis not present

## 2020-09-28 DIAGNOSIS — D2261 Melanocytic nevi of right upper limb, including shoulder: Secondary | ICD-10-CM | POA: Diagnosis not present

## 2020-09-28 DIAGNOSIS — L538 Other specified erythematous conditions: Secondary | ICD-10-CM | POA: Diagnosis not present

## 2020-09-28 DIAGNOSIS — L820 Inflamed seborrheic keratosis: Secondary | ICD-10-CM | POA: Diagnosis not present

## 2020-09-29 ENCOUNTER — Other Ambulatory Visit: Payer: Self-pay | Admitting: Obstetrics and Gynecology

## 2020-09-29 DIAGNOSIS — Z1231 Encounter for screening mammogram for malignant neoplasm of breast: Secondary | ICD-10-CM

## 2020-09-29 DIAGNOSIS — Z01419 Encounter for gynecological examination (general) (routine) without abnormal findings: Secondary | ICD-10-CM | POA: Diagnosis not present

## 2020-10-06 DIAGNOSIS — Z114 Encounter for screening for human immunodeficiency virus [HIV]: Secondary | ICD-10-CM | POA: Diagnosis not present

## 2020-10-06 DIAGNOSIS — Z0000 Encounter for general adult medical examination without abnormal findings: Secondary | ICD-10-CM | POA: Diagnosis not present

## 2020-10-06 DIAGNOSIS — Z13220 Encounter for screening for lipoid disorders: Secondary | ICD-10-CM | POA: Diagnosis not present

## 2020-10-06 DIAGNOSIS — D519 Vitamin B12 deficiency anemia, unspecified: Secondary | ICD-10-CM | POA: Diagnosis not present

## 2020-10-13 DIAGNOSIS — N200 Calculus of kidney: Secondary | ICD-10-CM | POA: Diagnosis not present

## 2020-10-13 DIAGNOSIS — D519 Vitamin B12 deficiency anemia, unspecified: Secondary | ICD-10-CM | POA: Diagnosis not present

## 2020-10-13 DIAGNOSIS — R002 Palpitations: Secondary | ICD-10-CM | POA: Diagnosis not present

## 2020-10-13 DIAGNOSIS — Z0000 Encounter for general adult medical examination without abnormal findings: Secondary | ICD-10-CM | POA: Diagnosis not present

## 2020-12-01 ENCOUNTER — Ambulatory Visit
Admission: RE | Admit: 2020-12-01 | Discharge: 2020-12-01 | Disposition: A | Discharge status: Home / Self Care | Payer: 59 | Source: Ambulatory Visit | Attending: Obstetrics and Gynecology | Admitting: Obstetrics and Gynecology

## 2020-12-01 ENCOUNTER — Other Ambulatory Visit: Payer: Self-pay

## 2020-12-01 DIAGNOSIS — Z1231 Encounter for screening mammogram for malignant neoplasm of breast: Secondary | ICD-10-CM | POA: Diagnosis not present

## 2021-03-07 DIAGNOSIS — H524 Presbyopia: Secondary | ICD-10-CM | POA: Diagnosis not present

## 2021-03-07 DIAGNOSIS — H5203 Hypermetropia, bilateral: Secondary | ICD-10-CM | POA: Diagnosis not present

## 2021-03-07 DIAGNOSIS — Z135 Encounter for screening for eye and ear disorders: Secondary | ICD-10-CM | POA: Diagnosis not present

## 2021-03-07 DIAGNOSIS — H52223 Regular astigmatism, bilateral: Secondary | ICD-10-CM | POA: Diagnosis not present

## 2021-04-27 ENCOUNTER — Other Ambulatory Visit: Payer: Self-pay

## 2021-04-27 MED ORDER — SULFAMETHOXAZOLE-TRIMETHOPRIM 800-160 MG PO TABS
ORAL_TABLET | ORAL | 0 refills | Status: DC
  Filled 2021-04-27: qty 14, 7d supply, fill #0

## 2021-04-27 MED ORDER — CEPHALEXIN 500 MG PO CAPS
500.0000 mg | ORAL_CAPSULE | ORAL | 0 refills | Status: DC
  Filled 2021-04-27: qty 21, 7d supply, fill #0

## 2021-04-28 ENCOUNTER — Other Ambulatory Visit: Payer: Self-pay

## 2021-04-28 ENCOUNTER — Encounter: Payer: Self-pay | Admitting: Emergency Medicine

## 2021-04-28 ENCOUNTER — Emergency Department
Admission: EM | Admit: 2021-04-28 | Discharge: 2021-04-29 | Disposition: A | Discharge status: Home / Self Care | Payer: 59 | Attending: Emergency Medicine | Admitting: Emergency Medicine

## 2021-04-28 DIAGNOSIS — L03211 Cellulitis of face: Secondary | ICD-10-CM | POA: Diagnosis not present

## 2021-04-28 LAB — CBC WITH DIFFERENTIAL/PLATELET
Abs Immature Granulocytes: 0.03 10*3/uL (ref 0.00–0.07)
Basophils Absolute: 0.1 10*3/uL (ref 0.0–0.1)
Basophils Relative: 1 %
Eosinophils Absolute: 0.2 10*3/uL (ref 0.0–0.5)
Eosinophils Relative: 2 %
HCT: 38.4 % (ref 36.0–46.0)
Hemoglobin: 12.7 g/dL (ref 12.0–15.0)
Immature Granulocytes: 0 %
Lymphocytes Relative: 24 %
Lymphs Abs: 2 10*3/uL (ref 0.7–4.0)
MCH: 29.9 pg (ref 26.0–34.0)
MCHC: 33.1 g/dL (ref 30.0–36.0)
MCV: 90.4 fL (ref 80.0–100.0)
Monocytes Absolute: 0.8 10*3/uL (ref 0.1–1.0)
Monocytes Relative: 10 %
Neutro Abs: 5.2 10*3/uL (ref 1.7–7.7)
Neutrophils Relative %: 63 %
Platelets: 399 10*3/uL (ref 150–400)
RBC: 4.25 MIL/uL (ref 3.87–5.11)
RDW: 13.5 % (ref 11.5–15.5)
WBC: 8.3 10*3/uL (ref 4.0–10.5)
nRBC: 0 % (ref 0.0–0.2)

## 2021-04-28 LAB — COMPREHENSIVE METABOLIC PANEL
ALT: 21 U/L (ref 0–44)
AST: 37 U/L (ref 15–41)
Albumin: 4.3 g/dL (ref 3.5–5.0)
Alkaline Phosphatase: 73 U/L (ref 38–126)
Anion gap: 8 (ref 5–15)
BUN: 8 mg/dL (ref 6–20)
CO2: 24 mmol/L (ref 22–32)
Calcium: 9 mg/dL (ref 8.9–10.3)
Chloride: 103 mmol/L (ref 98–111)
Creatinine, Ser: 0.9 mg/dL (ref 0.44–1.00)
GFR, Estimated: >60 mL/min (ref >60)
Glucose, Bld: 109 mg/dL — ABNORMAL HIGH (ref 70–99)
Potassium: 3.5 mmol/L (ref 3.5–5.1)
Sodium: 135 mmol/L (ref 135–145)
Total Bilirubin: 0.7 mg/dL (ref 0.3–1.2)
Total Protein: 7.4 g/dL (ref 6.5–8.1)

## 2021-04-28 MED ORDER — MUPIROCIN 2 % EX OINT
TOPICAL_OINTMENT | CUTANEOUS | 2 refills | Status: DC
  Filled 2021-04-28: qty 22, 7d supply, fill #0

## 2021-04-28 MED ORDER — DEXAMETHASONE SODIUM PHOSPHATE 10 MG/ML IJ SOLN
10.0000 mg | Freq: Once | INTRAMUSCULAR | Status: AC
  Administered 2021-04-28: 10 mg via INTRAVENOUS
  Filled 2021-04-28: qty 1

## 2021-04-28 MED ORDER — VANCOMYCIN HCL IN DEXTROSE 1-5 GM/200ML-% IV SOLN
1000.0000 mg | Freq: Once | INTRAVENOUS | Status: AC
  Administered 2021-04-28: 1000 mg via INTRAVENOUS
  Filled 2021-04-28: qty 200

## 2021-04-28 NOTE — ED Triage Notes (Signed)
Pt with facial pain and swelling.  Airway intact, maintaining secretions, in NAD at this time.  See provider note for more info.

## 2021-04-28 NOTE — ED Provider Notes (Signed)
[facility]-EMERGENCY DEPARTMENT  ____________________________________________  Time seen: Approximately 11:27 PM  I have reviewed the triage vital signs and the nursing notes.   HISTORY  Chief Complaint Facial Pain   Historian Patient    HPI Lindsey Sullivan is a 60 y.o. female with a history of MRSA presents to the emergency department with concern for cellulitis of the nose and upper lip likely from nasal vestibular furunculosis.  Patient has had 2 IM injections of Rocephin and has had 4 doses of Bactrim and 3 doses of Keflex over the course of 2 days. Patient also reports that she took 2-3 doses of Ciprofloxacin. She is concerned that upper lip feels more indurated and seems more edematous today.  She has had no fever but has had less energy today and has had some nausea.  She reports that she had facial cellulitis 7 years ago in a similar location that responded well to vancomycin and steroids.   Past Medical History:  Diagnosis Date   Kidney stone      Immunizations up to date:  Yes.     Past Medical History:  Diagnosis Date   Kidney stone     There are no problems to display for this patient.   Past Surgical History:  Procedure Laterality Date   BREAST BIOPSY Right 2000   negative   BREAST EXCISIONAL BIOPSY Left 2018   benign   LEEP      Prior to Admission medications   Medication Sig Start Date End Date Taking? Authorizing Provider  cephALEXin (KEFLEX) 500 MG capsule Take one capsule by mouth 3 times a day for 7 days 04/27/21   Caryn Section, Linden Dolin, PA-C  ciprofloxacin (CIPRO) 500 MG tablet TAKE 1 TABLET BY MOUTH 2 TIMES DAILY FOR 7 DAYS 07/20/20 07/20/21  Caryn Section, Linden Dolin, PA-C  mupirocin ointment (BACTROBAN) 2 % Apply to left nostril 3 times a day for 10 days 04/27/21   Cuthriell, Charline Bills, PA-C  naproxen sodium (ANAPROX) 220 MG tablet Take 220 mg by mouth as needed.    [provider]  phenazopyridine (PYRIDIUM) 200 MG tablet TAKE 1 TABLET BY MOUTH 3 TIMES  DAILY AS NEEDED 07/20/20 07/20/21  Caryn Section Linden Dolin, PA-C  sulfamethoxazole-trimethoprim (BACTRIM DS) 800-160 MG tablet Take one tablet by mouth twice a day for 7 days 04/27/21   Caryn Section Linden Dolin, PA-C    Allergies Diflucan [fluconazole]  Family History  Problem Relation Age of Onset   Breast cancer Mother 82       twice    Social History Social History   Tobacco Use   Smoking status: Never   Smokeless tobacco: Never  Substance Use Topics   Alcohol use: Yes    Alcohol/week: 0.0 standard drinks   Drug use: No     Review of Systems  Constitutional: No fever/chills Eyes:  No discharge ENT: No upper respiratory complaints. Respiratory: no cough. No SOB/ use of accessory muscles to breath Gastrointestinal:   No nausea, no vomiting.  No diarrhea.  No constipation. Musculoskeletal: Negative for musculoskeletal pain. Skin: Patient has facial cellulitis.     ____________________________________________   PHYSICAL EXAM:  VITAL SIGNS: ED Triage Vitals  Enc Vitals Group     BP 04/28/21 2316 127/70     Pulse Rate 04/28/21 2316 71     Resp 04/28/21 2316 14     Temp 04/28/21 2316 97.7 F (36.5 C)     Temp Source 04/28/21 2316 Oral     SpO2 04/28/21 2316  98 %     Weight 04/28/21 2315 110 lb (49.9 kg)     Height 04/28/21 2315 5\' 4"  (1.626 m)     Head Circumference --      Peak Flow --      Pain Score 04/28/21 2315 5     Pain Loc --      Pain Edu? --      Excl. in [place]? --      Constitutional: Alert and oriented. Well appearing and in no acute distress. Eyes: Conjunctivae are normal. PERRL. EOMI. Head: Atraumatic. ENT:      Nose: No congestion/rhinnorhea.      Mouth/Throat: Mucous membranes are moist.  Neck: No stridor.  No cervical spine tenderness to palpation. Cardiovascular: Normal rate, regular rhythm. Normal S1 and S2.  Good peripheral circulation. Respiratory: Normal respiratory effort without tachypnea or retractions. Lungs CTAB. Good air entry to the bases with no  decreased or absent breath sounds Gastrointestinal: Bowel sounds x 4 quadrants. Soft and nontender to palpation. No guarding or rigidity. No distention. Musculoskeletal: Full range of motion to all extremities. No obvious deformities noted Neurologic:  Normal for age. No gross focal neurologic deficits are appreciated.  Skin: Patient has edema and mild erythema of the upper lip.  She has palpable induration but no fluctuance.  Patient has a nasal furuncle of the left nare that is spontaneously draining. Psychiatric: Mood and affect are normal for age. Speech and behavior are normal.   ____________________________________________   LABS (all labs ordered are listed, but only abnormal results are displayed)  Labs Reviewed  AEROBIC/ANAEROBIC CULTURE W GRAM STAIN (SURGICAL/DEEP WOUND)  CBC WITH DIFFERENTIAL/PLATELET  COMPREHENSIVE METABOLIC PANEL   ____________________________________________  EKG   ____________________________________________  RADIOLOGY   No results found.  ____________________________________________    PROCEDURES  Procedure(s) performed:     Procedures     Medications  vancomycin (VANCOCIN) IVPB 1000 mg/200 mL premix (1,000 mg Intravenous New Bag/Given 04/28/21 2324)  dexamethasone (DECADRON) injection 10 mg (10 mg Intravenous Given 04/28/21 2322)     ____________________________________________   INITIAL IMPRESSION / ASSESSMENT AND PLAN / ED COURSE  Pertinent labs & imaging results that were available during my care of the patient were reviewed by me and considered in my medical decision making (see chart for details).    Assessment and plan:  Facial cellulitis 60 year old female with history of MRSA presents to the emergency department with worsening facial cellulitis despite taking Ceftriaxone and  Bactrim and Keflex at home.  Vital signs were reassuring at triage.  On physical exam, patient was alert, active and nontoxic-appearing.   CBC indicated normal white blood cell count.  Patient was given IV vancomycin and Decadron in the emergency department.  Wound culture was sent and is in process at this time.  Patient was advised to continue taking Bactrim and Keflex as directed.  All patient questions were answered.     ____________________________________________  FINAL CLINICAL IMPRESSION(S) / ED DIAGNOSES  Final diagnoses:  Facial cellulitis      NEW MEDICATIONS STARTED DURING THIS VISIT:  ED Discharge Orders     None           This chart was dictated using voice recognition software/Dragon. Despite best efforts to proofread, errors can occur which can change the meaning. Any change was purely unintentional.     Lannie Fields, PA-C 04/28/21 2344    Carrie Mew, MD 05/01/21 0004

## 2021-04-29 ENCOUNTER — Other Ambulatory Visit: Payer: Self-pay

## 2021-04-29 MED ORDER — PREDNISONE 10 MG PO TABS
ORAL_TABLET | ORAL | 0 refills | Status: DC
Start: 2021-04-29 — End: 2023-10-10
  Filled 2021-04-29: qty 48, 12d supply, fill #0

## 2021-04-29 MED ORDER — GENTAMICIN SULFATE 0.1 % EX OINT
TOPICAL_OINTMENT | CUTANEOUS | 3 refills | Status: DC
  Filled 2021-04-29: qty 30, 30d supply, fill #0

## 2021-04-29 NOTE — ED Notes (Signed)
Patient verbalizes understanding of discharge instructions. Opportunity for questioning and answers were provided. Armband removed by staff, pt discharged from ED. Ambulated out to lobby  

## 2021-05-02 ENCOUNTER — Other Ambulatory Visit: Payer: Self-pay

## 2021-05-02 ENCOUNTER — Telehealth: Payer: Self-pay | Admitting: Emergency Medicine

## 2021-05-02 MED ORDER — SULFAMETHOXAZOLE-TRIMETHOPRIM 800-160 MG PO TABS
1.0000 | ORAL_TABLET | Freq: Two times a day (BID) | ORAL | 0 refills | Status: AC
  Filled 2021-05-02: qty 14, 7d supply, fill #0

## 2021-05-02 NOTE — Telephone Encounter (Cosign Needed)
Needs 7 more days of Bactrim per ENT.

## 2021-05-04 LAB — AEROBIC/ANAEROBIC CULTURE W GRAM STAIN (SURGICAL/DEEP WOUND): Special Requests: NORMAL

## 2021-10-05 DIAGNOSIS — Z85828 Personal history of other malignant neoplasm of skin: Secondary | ICD-10-CM | POA: Diagnosis not present

## 2021-10-05 DIAGNOSIS — D2261 Melanocytic nevi of right upper limb, including shoulder: Secondary | ICD-10-CM | POA: Diagnosis not present

## 2021-10-05 DIAGNOSIS — D2262 Melanocytic nevi of left upper limb, including shoulder: Secondary | ICD-10-CM | POA: Diagnosis not present

## 2021-10-05 DIAGNOSIS — L570 Actinic keratosis: Secondary | ICD-10-CM | POA: Diagnosis not present

## 2021-10-05 DIAGNOSIS — L814 Other melanin hyperpigmentation: Secondary | ICD-10-CM | POA: Diagnosis not present

## 2021-10-05 DIAGNOSIS — D225 Melanocytic nevi of trunk: Secondary | ICD-10-CM | POA: Diagnosis not present

## 2021-10-12 DIAGNOSIS — D519 Vitamin B12 deficiency anemia, unspecified: Secondary | ICD-10-CM | POA: Diagnosis not present

## 2021-10-12 DIAGNOSIS — Z0000 Encounter for general adult medical examination without abnormal findings: Secondary | ICD-10-CM | POA: Diagnosis not present

## 2021-10-12 DIAGNOSIS — N200 Calculus of kidney: Secondary | ICD-10-CM | POA: Diagnosis not present

## 2021-10-12 DIAGNOSIS — E7800 Pure hypercholesterolemia, unspecified: Secondary | ICD-10-CM | POA: Diagnosis not present

## 2021-10-12 DIAGNOSIS — R233 Spontaneous ecchymoses: Secondary | ICD-10-CM | POA: Diagnosis not present

## 2021-10-17 DIAGNOSIS — Z124 Encounter for screening for malignant neoplasm of cervix: Secondary | ICD-10-CM | POA: Diagnosis not present

## 2021-10-17 DIAGNOSIS — Z01419 Encounter for gynecological examination (general) (routine) without abnormal findings: Secondary | ICD-10-CM | POA: Diagnosis not present

## 2021-10-17 DIAGNOSIS — Z1231 Encounter for screening mammogram for malignant neoplasm of breast: Secondary | ICD-10-CM | POA: Diagnosis not present

## 2021-10-19 ENCOUNTER — Other Ambulatory Visit: Payer: Self-pay | Admitting: Obstetrics and Gynecology

## 2021-10-19 DIAGNOSIS — Z0000 Encounter for general adult medical examination without abnormal findings: Secondary | ICD-10-CM | POA: Diagnosis not present

## 2021-10-19 DIAGNOSIS — D519 Vitamin B12 deficiency anemia, unspecified: Secondary | ICD-10-CM | POA: Diagnosis not present

## 2021-10-19 DIAGNOSIS — Z1211 Encounter for screening for malignant neoplasm of colon: Secondary | ICD-10-CM | POA: Diagnosis not present

## 2021-10-19 DIAGNOSIS — E7800 Pure hypercholesterolemia, unspecified: Secondary | ICD-10-CM | POA: Diagnosis not present

## 2021-10-19 DIAGNOSIS — N200 Calculus of kidney: Secondary | ICD-10-CM | POA: Diagnosis not present

## 2021-10-19 DIAGNOSIS — Z1231 Encounter for screening mammogram for malignant neoplasm of breast: Secondary | ICD-10-CM

## 2021-10-19 DIAGNOSIS — R002 Palpitations: Secondary | ICD-10-CM | POA: Diagnosis not present

## 2021-10-26 DIAGNOSIS — Z1211 Encounter for screening for malignant neoplasm of colon: Secondary | ICD-10-CM | POA: Diagnosis not present

## 2021-11-17 ENCOUNTER — Other Ambulatory Visit: Payer: Self-pay

## 2021-11-17 MED ORDER — NA SULFATE-K SULFATE-MG SULF 17.5-3.13-1.6 GM/177ML PO SOLN
ORAL | 0 refills | Status: DC
  Filled 2021-11-17: qty 354, 1d supply, fill #0

## 2021-12-07 ENCOUNTER — Ambulatory Visit
Admission: RE | Admit: 2021-12-07 | Discharge: 2021-12-07 | Disposition: A | Discharge status: Home / Self Care | Payer: 59 | Source: Ambulatory Visit | Attending: Obstetrics and Gynecology | Admitting: Obstetrics and Gynecology

## 2021-12-07 DIAGNOSIS — Z1231 Encounter for screening mammogram for malignant neoplasm of breast: Secondary | ICD-10-CM | POA: Diagnosis not present

## 2022-01-27 ENCOUNTER — Encounter: Payer: Self-pay | Admitting: Gastroenterology

## 2022-01-29 ENCOUNTER — Encounter: Payer: Self-pay | Admitting: Gastroenterology

## 2022-01-29 NOTE — H&P (Signed)
Pre-Procedure H&P   Patient ID: Lindsey Sullivan is a 61 y.o. female.  Gastroenterology Provider: Annamaria Helling, DO  Referring Provider: Dr. Caryl Comes PCP: Adin Hector, MD  Date: 01/30/2022  HPI Lindsey Sullivan is a 61 y.o. female who presents today for Colonoscopy for Positive Cologuard testing on 10/26/2021.  No previous endoscopy.  Mother with colon polyps in her 40s.  No family history of colorectal cancer Patient reports normal bowel movements without melena hematochezia diarrhea or constipation  Most recent lab work creatinine 0.8 hemoglobin 13.3 MCV 93 platelets 341,000   Past Medical History:  Diagnosis Date   B12 deficiency    History of abnormal cervical Pap smear    History of kidney stones     Past Surgical History:  Procedure Laterality Date   BREAST BIOPSY Right 2000   negative   BREAST EXCISIONAL BIOPSY Left 2018   benign   LEEP      Family History Mother- polyps- 81s No other h/o GI disease or malignancy  Review of Systems  Constitutional:  Negative for activity change, appetite change, chills, diaphoresis, fatigue, fever and unexpected weight change.  HENT:  Negative for trouble swallowing and voice change.   Respiratory:  Negative for shortness of breath and wheezing.   Cardiovascular:  Negative for chest pain, palpitations and leg swelling.  Gastrointestinal:  Negative for abdominal distention, abdominal pain, anal bleeding, blood in stool, constipation, diarrhea, nausea, rectal pain and vomiting.  Musculoskeletal:  Negative for arthralgias and myalgias.  Skin:  Negative for color change and pallor.  Neurological:  Negative for dizziness, syncope and weakness.  Psychiatric/Behavioral:  Negative for confusion.   All other systems reviewed and are negative.    Medications No current facility-administered medications on file prior to encounter.   Current Outpatient Medications on File Prior to Encounter  Medication Sig Dispense Refill    gentamicin ointment (GARAMYCIN) 0.1 % apply to infected nose tid for 2 weeks 30 g 3   mupirocin ointment (BACTROBAN) 2 % Apply to left nostril 3 times a day for 10 days 22 g 2   Na Sulfate-K Sulfate-Mg Sulf 17.5-3.13-1.6 GM/177ML SOLN Take 1 Bottle by mouth as directed One kit contains 2 bottles.  Take both bottles at the times instructed by your provider. 354 mL 0   cephALEXin (KEFLEX) 500 MG capsule Take one capsule by mouth 3 times a day for 7 days 21 capsule 0   naproxen sodium (ANAPROX) 220 MG tablet Take 220 mg by mouth as needed. (Patient not taking: Reported on 01/30/2022)     predniSONE (DELTASONE) 10 MG tablet Take 6 tablets by mouth daily for 4 days, 4 tablets for 4 days, then 2 tablets for 4 days 48 tablet 0    Pertinent medications related to GI and procedure were reviewed by me with the patient prior to the procedure   Current Facility-Administered Medications:    0.9 %  sodium chloride infusion, , Intravenous, Continuous, Annamaria Helling, DO      Allergies  Allergen Reactions   Diflucan [Fluconazole] Rash   Allergies were reviewed by me prior to the procedure  Objective   Body mass index is 19.17 kg/m. Vitals:   01/30/22 0743  BP: 104/61  Pulse: 65  Resp: 16  Temp: (!) 96.9 F (36.1 C)  TempSrc: Temporal  SpO2: 100%  Weight: 49.1 kg  Height: 5' 3" (1.6 m)     Physical Exam Vitals and nursing note reviewed.  Constitutional:      General: She is not in acute distress.    Appearance: Normal appearance. She is not ill-appearing, toxic-appearing or diaphoretic.  HENT:     Head: Normocephalic and atraumatic.     Nose: Nose normal.     Mouth/Throat:     Mouth: Mucous membranes are moist.     Pharynx: Oropharynx is clear.  Eyes:     General: No scleral icterus.    Extraocular Movements: Extraocular movements intact.  Cardiovascular:     Rate and Rhythm: Normal rate and regular rhythm.     Heart sounds: Normal heart sounds. No murmur heard.    No  friction rub. No gallop.  Pulmonary:     Effort: Pulmonary effort is normal. No respiratory distress.     Breath sounds: Normal breath sounds. No wheezing, rhonchi or rales.  Abdominal:     General: Abdomen is flat. Bowel sounds are normal. There is no distension.     Palpations: Abdomen is soft.     Tenderness: There is no abdominal tenderness. There is no guarding or rebound.  Musculoskeletal:     Cervical back: Neck supple.     Right lower leg: No edema.     Left lower leg: No edema.  Skin:    General: Skin is warm and dry.     Coloration: Skin is not jaundiced or pale.  Neurological:     General: No focal deficit present.     Mental Status: She is alert and oriented to person, place, and time. Mental status is at baseline.  Psychiatric:        Mood and Affect: Mood normal.        Behavior: Behavior normal.        Thought Content: Thought content normal.        Judgment: Judgment normal.      Assessment:  Lindsey Sullivan is a 61 y.o. female  who presents today for Colonoscopy for Positive Cologuard.  Plan:  Colonoscopy with possible intervention today  Colonoscopy with possible biopsy, control of bleeding, polypectomy, and interventions as necessary has been discussed with the patient/patient representative. Informed consent was obtained from the patient/patient representative after explaining the indication, nature, and risks of the procedure including but not limited to death, bleeding, perforation, missed neoplasm/lesions, cardiorespiratory compromise, and reaction to medications. Opportunity for questions was given and appropriate answers were provided. Patient/patient representative has verbalized understanding is amenable to undergoing the procedure.   Annamaria Helling, DO  [facility]  Portions of the record may have been created with voice recognition software. Occasional wrong-word or 'sound-a-like' substitutions may have occurred due to  the inherent limitations of voice recognition software.  Read the chart carefully and recognize, using context, where substitutions may have occurred.

## 2022-01-30 ENCOUNTER — Ambulatory Visit
Admission: RE | Admit: 2022-01-30 | Discharge: 2022-01-30 | Disposition: A | Discharge status: Home / Self Care | Payer: 59 | Source: Ambulatory Visit | Attending: Gastroenterology | Admitting: Gastroenterology

## 2022-01-30 ENCOUNTER — Ambulatory Visit: Payer: 59 | Admitting: Anesthesiology

## 2022-01-30 ENCOUNTER — Other Ambulatory Visit: Payer: Self-pay

## 2022-01-30 ENCOUNTER — Encounter: Payer: Self-pay | Admitting: Gastroenterology

## 2022-01-30 ENCOUNTER — Encounter
Admission: RE | Disposition: A | Discharge status: Home / Self Care | Payer: Self-pay | Source: Ambulatory Visit | Attending: Gastroenterology

## 2022-01-30 DIAGNOSIS — K635 Polyp of colon: Secondary | ICD-10-CM | POA: Insufficient documentation

## 2022-01-30 DIAGNOSIS — K621 Rectal polyp: Secondary | ICD-10-CM | POA: Diagnosis not present

## 2022-01-30 DIAGNOSIS — B820 Intestinal helminthiasis, unspecified: Secondary | ICD-10-CM | POA: Insufficient documentation

## 2022-01-30 DIAGNOSIS — D123 Benign neoplasm of transverse colon: Secondary | ICD-10-CM | POA: Diagnosis not present

## 2022-01-30 DIAGNOSIS — D125 Benign neoplasm of sigmoid colon: Secondary | ICD-10-CM | POA: Diagnosis not present

## 2022-01-30 DIAGNOSIS — R195 Other fecal abnormalities: Secondary | ICD-10-CM | POA: Insufficient documentation

## 2022-01-30 DIAGNOSIS — Z1211 Encounter for screening for malignant neoplasm of colon: Secondary | ICD-10-CM | POA: Diagnosis not present

## 2022-01-30 DIAGNOSIS — K640 First degree hemorrhoids: Secondary | ICD-10-CM | POA: Diagnosis not present

## 2022-01-30 DIAGNOSIS — Z8371 Family history of colonic polyps: Secondary | ICD-10-CM | POA: Insufficient documentation

## 2022-01-30 HISTORY — DX: Personal history of urinary calculi: Z87.442

## 2022-01-30 HISTORY — PX: COLONOSCOPY: SHX5424

## 2022-01-30 HISTORY — DX: Personal history of other diseases of the female genital tract: Z87.42

## 2022-01-30 HISTORY — DX: Deficiency of other specified B group vitamins: E53.8

## 2022-01-30 SURGERY — COLONOSCOPY
Anesthesia: General

## 2022-01-30 MED ORDER — SODIUM CHLORIDE 0.9 % IV SOLN: INTRAVENOUS | Status: DC

## 2022-01-30 MED ORDER — PROPOFOL 1000 MG/100ML IV EMUL
INTRAVENOUS | Status: AC
  Filled 2022-01-30: qty 100

## 2022-01-30 MED ORDER — ALBENDAZOLE 200 MG PO TABS
400.0000 mg | ORAL_TABLET | Freq: Once | ORAL | 0 refills | Status: AC
  Filled 2022-01-30: qty 4, 2d supply, fill #0

## 2022-01-30 MED ORDER — PROPOFOL 500 MG/50ML IV EMUL
INTRAVENOUS | Status: DC | PRN
  Administered 2022-01-30: 150 ug/kg/min via INTRAVENOUS

## 2022-01-30 NOTE — Anesthesia Postprocedure Evaluation (Signed)
Anesthesia Post Note  Patient: Lindsey Sullivan  Procedure(s) Performed: COLONOSCOPY  Patient location during evaluation: PACU Anesthesia Type: General Level of consciousness: awake and awake and alert Pain management: satisfactory to patient Vital Signs Assessment: post-procedure vital signs reviewed and stable Respiratory status: spontaneous breathing and respiratory function stable Cardiovascular status: stable Anesthetic complications: no   No notable events documented.   Last Vitals:  Vitals:   01/30/22 1000 01/30/22 1010  BP: (!) 85/59 98/67  Pulse: (!) 55 (!) 54  Resp: 13 (!) 22  Temp:    SpO2: 96% 100%    Last Pain:  Vitals:   01/30/22 1010  TempSrc:   PainSc: 0-No pain                 VAN STAVEREN,Rya Rausch

## 2022-01-30 NOTE — Transfer of Care (Signed)
Immediate Anesthesia Transfer of Care Note  Patient: Lindsey Sullivan  Procedure(s) Performed: COLONOSCOPY  Patient Location: PACU  Anesthesia Type:General  Level of Consciousness: awake and sedated  Airway & Oxygen Therapy: Patient Spontanous Breathing and Patient connected to nasal cannula oxygen  Post-op Assessment: Report given to RN and Post -op Vital signs reviewed and stable  Post vital signs: Reviewed and stable  Last Vitals:  Vitals Value Taken Time  BP    Temp    Pulse    Resp    SpO2      Last Pain:  Vitals:   01/30/22 0743  TempSrc: Temporal  PainSc: 0-No pain         Complications: No notable events documented.

## 2022-01-30 NOTE — Anesthesia Preprocedure Evaluation (Signed)
Anesthesia Evaluation  Patient identified by MRN, date of birth, ID band Patient awake    Reviewed: Allergy & Precautions, NPO status , Patient's Chart, lab work & pertinent test results  Airway Mallampati: II  TM Distance: >3 FB Neck ROM: full    Dental  (+) Teeth Intact   Pulmonary neg pulmonary ROS,    Pulmonary exam normal breath sounds clear to auscultation       Cardiovascular Exercise Tolerance: Good negative cardio ROS Normal cardiovascular exam Rhythm:Regular Rate:Normal     Neuro/Psych negative neurological ROS  negative psych ROS   GI/Hepatic negative GI ROS, Neg liver ROS,   Endo/Other  negative endocrine ROS  Renal/GU negative Renal ROS  negative genitourinary   Musculoskeletal   Abdominal Normal abdominal exam  (+)   Peds negative pediatric ROS (+)  Hematology negative hematology ROS (+)   Anesthesia Other Findings Past Medical History: No date: B12 deficiency No date: History of abnormal cervical Pap smear No date: History of kidney stones  Past Surgical History: 2000: BREAST BIOPSY; Right     Comment:  negative 2018: BREAST EXCISIONAL BIOPSY; Left     Comment:  benign No date: LEEP  BMI    Body Mass Index: 19.17 kg/m      Reproductive/Obstetrics negative OB ROS                             Anesthesia Physical Anesthesia Plan  ASA: 2  Anesthesia Plan: General   Post-op Pain Management:    Induction: Intravenous  PONV Risk Score and Plan: Propofol infusion and TIVA  Airway Management Planned: Natural Airway  Additional Equipment:   Intra-op Plan:   Post-operative Plan:   Informed Consent: I have reviewed the patients History and Physical, chart, labs and discussed the procedure including the risks, benefits and alternatives for the proposed anesthesia with the patient or authorized representative who has indicated his/her understanding and  acceptance.     Dental Advisory Given  Plan Discussed with: CRNA and Surgeon  Anesthesia Plan Comments:         Anesthesia Quick Evaluation

## 2022-01-30 NOTE — Interval H&P Note (Signed)
History and Physical Interval Note: Preprocedure H&P from 01/30/22  was reviewed and there was no interval change after seeing and examining the patient.  Written consent was obtained from the patient after discussion of risks, benefits, and alternatives. Patient has consented to proceed with Colonoscopy with possible intervention   01/30/2022 8:47 AM  Lindsey Sullivan  has presented today for surgery, with the diagnosis of Positive colorectal cancer screening using Cologuard test (R19.5) Colon cancer screening (Z12.11).  The various methods of treatment have been discussed with the patient and family. After consideration of risks, benefits and other options for treatment, the patient has consented to  Procedure(s) with comments: COLONOSCOPY (N/A) - AM PREFERRED as a surgical intervention.  The patient's history has been reviewed, patient examined, no change in status, stable for surgery.  I have reviewed the patient's chart and labs.  Questions were answered to the patient's satisfaction.     Annamaria Helling

## 2022-01-30 NOTE — Op Note (Signed)
[facility] Patient Name: Lindsey Sullivan Procedure Date: 01/30/2022 8:40 AM MRN: [id number] Account #: [id number] Date of Birth: Aug 07, 1960 Admit Type: Outpatient Age: 61 Room: [facility] ENDO ROOM 2 Gender: Female Note Status: Finalized Instrument Name: Peds Colonoscope [id number] Procedure:             Colonoscopy Indications:           Positive Cologuard test Providers:             Annamaria Helling DO, DO Medicines:             Monitored Anesthesia Care Complications:         No immediate complications. Estimated blood loss:                         Minimal. Procedure:             Pre-Anesthesia Assessment:                        - Prior to the procedure, a History and Physical was                         performed, and patient medications and allergies were                         reviewed. The patient is competent. The risks and                         benefits of the procedure and the sedation options and                         risks were discussed with the patient. All questions                         were answered and informed consent was obtained.                         Patient identification and proposed procedure were                         verified by the physician, the nurse, the anesthetist                         and the technician in the endoscopy suite. Mental                         Status Examination: alert and oriented. Airway                         Examination: normal oropharyngeal airway and neck                         mobility. Respiratory Examination: clear to                         auscultation. CV Examination: RRR, no murmurs, no S3                         or S4. Prophylactic Antibiotics: The patient does not  require prophylactic antibiotics. Prior                         Anticoagulants: The patient has taken no previous                         anticoagulant or antiplatelet agents. ASA Grade                          Assessment: II - A patient with mild systemic disease.                         After reviewing the risks and benefits, the patient                         was deemed in satisfactory condition to undergo the                         procedure. The anesthesia plan was to use monitored                         anesthesia care (MAC). Immediately prior to                         administration of medications, the patient was                         re-assessed for adequacy to receive sedatives. The                         heart rate, respiratory rate, oxygen saturations,                         blood pressure, adequacy of pulmonary ventilation, and                         response to care were monitored throughout the                         procedure. The physical status of the patient was                         re-assessed after the procedure.                        After obtaining informed consent, the colonoscope was                         passed under direct vision. Throughout the procedure,                         the patient's blood pressure, pulse, and oxygen                         saturations were monitored continuously. The                         Colonoscope was introduced through the anus and  advanced to the the terminal ileum, with                         identification of the appendiceal orifice and IC                         valve. The colonoscopy was performed without                         difficulty. The patient tolerated the procedure well.                         The quality of the bowel preparation was evaluated                         using the BBPS [facility] Bowel Preparation Scale) with                         scores of: Right Colon = 3, Transverse Colon = 3 and                         Left Colon = 3 (entire mucosa seen well with no                         residual staining, small fragments of stool or opaque                          liquid). The total BBPS score equals 9. The terminal                         ileum, ileocecal valve, appendiceal orifice, and                         rectum were photographed. Findings:      The perianal and digital rectal examinations were normal. Pertinent       negatives include normal sphincter tone.      The terminal ileum appeared normal. Estimated blood loss: none.      Worms were found in the cecum. Suctioned out. Estimated blood loss: none.      Three sessile polyps were found in the transverse colon and hepatic       flexure. The polyps were 3 to 5 mm in size. These polyps were removed       with a cold snare. Resection and retrieval were complete. Estimated       blood loss was minimal.      A 1 mm polyp was found in the hepatic flexure. The polyp was sessile.       The polyp was removed with a jumbo cold forceps. Resection and retrieval       were complete. Estimated blood loss was minimal.      A 7 to 9 mm polyp was found in the transverse colon. The polyp was       sessile. The polyp was removed with a cold snare. Resection and       retrieval were complete. Estimated blood loss was minimal. To prevent       bleeding after the polypectomy, two hemostatic clips were successfully       placed (MR  conditional). There was no bleeding at the end of the       procedure. Estimated blood loss was minimal.      Two sessile polyps were found in the rectum and descending colon. The       polyps were 1 to 2 mm in size. These polyps were removed with a jumbo       cold forceps. Resection and retrieval were complete. Estimated blood       loss was minimal.      Non-bleeding internal hemorrhoids were found during retroflexion. The       hemorrhoids were Grade I (internal hemorrhoids that do not prolapse).       Estimated blood loss: none.      The exam was otherwise without abnormality on direct and retroflexion       views. Impression:            - The examined portion of the ileum was  normal.                        - Intestinal worms.                        - Three 3 to 5 mm polyps in the transverse colon and                         at the hepatic flexure, removed with a cold snare.                         Resected and retrieved.                        - One 1 mm polyp at the hepatic flexure, removed with                         a jumbo cold forceps. Resected and retrieved.                        - One 7 to 9 mm polyp in the transverse colon, removed                         with a cold snare. Resected and retrieved. Clips (MR                         conditional) were placed.                        - Two 1 to 2 mm polyps in the rectum and in the                         descending colon, removed with a jumbo cold forceps.                         Resected and retrieved.                        - Non-bleeding internal hemorrhoids.                        - The examination was otherwise normal on  direct and                         retroflexion views. Recommendation:        - Patient has a contact number available for                         emergencies. The signs and symptoms of potential                         delayed complications were discussed with the patient.                         Return to normal activities tomorrow. Written                         discharge instructions were provided to the patient.                        - Discharge patient to home.                        - Resume previous diet.                        - Continue present medications.                        - No aspirin, ibuprofen, naproxen, or other                         non-steroidal anti-inflammatory drugs for 5 days after                         polyp removal.                        - Await pathology results.                        - Will send prescription in for anti-helminth                        - Repeat colonoscopy for surveillance based on                         pathology results.                         - Return to referring physician as previously                         scheduled.                        - The findings and recommendations were discussed with                         the patient. Procedure Code(s):     --- Professional ---                        [phone], Colonoscopy, flexible; with removal of  tumor(s), polyp(s), or other lesion(s) by snare                         technique                        45380, 59, Colonoscopy, flexible; with biopsy, single                         or multiple Diagnosis Code(s):     --- Professional ---                        K64.0, First degree hemorrhoids                        B82.0, Intestinal helminthiasis, unspecified                        K62.1, Rectal polyp                        K63.5, Polyp of colon                        R19.5, Other fecal abnormalities CPT copyright 2019 American Medical Association. All rights reserved. The codes documented in this report are preliminary and upon coder review may  be revised to meet current compliance requirements. Attending Participation:      I personally performed the entire procedure. Volney American, DO Annamaria Helling DO, DO 01/30/2022 9:51:43 AM This report has been signed electronically. Number of Addenda: 0 Note Initiated On: 01/30/2022 8:40 AM Scope Withdrawal Time: 0 hours 39 minutes 53 seconds  Total Procedure Duration: 0 hours 46 minutes 9 seconds  Estimated Blood Loss:  Estimated blood loss was minimal.      [facility]

## 2022-01-30 NOTE — Anesthesia Procedure Notes (Signed)
Date/Time: 01/30/2022 9:04 AM  Performed by: Donalda Ewings, CindyPre-anesthesia Checklist: Patient identified, Emergency Drugs available, Suction available, Patient being monitored and Timeout performed Patient Re-evaluated:Patient Re-evaluated prior to induction Oxygen Delivery Method: Nasal cannula Preoxygenation: Pre-oxygenation with 100% oxygen Induction Type: IV induction Placement Confirmation: positive ETCO2 and CO2 detector

## 2022-01-31 ENCOUNTER — Encounter: Payer: Self-pay | Admitting: Gastroenterology

## 2022-01-31 ENCOUNTER — Other Ambulatory Visit: Payer: Self-pay

## 2022-01-31 LAB — SURGICAL PATHOLOGY

## 2022-03-07 DIAGNOSIS — Z135 Encounter for screening for eye and ear disorders: Secondary | ICD-10-CM | POA: Diagnosis not present

## 2022-03-07 DIAGNOSIS — H524 Presbyopia: Secondary | ICD-10-CM | POA: Diagnosis not present

## 2022-03-07 DIAGNOSIS — H52223 Regular astigmatism, bilateral: Secondary | ICD-10-CM | POA: Diagnosis not present

## 2022-03-07 DIAGNOSIS — H5203 Hypermetropia, bilateral: Secondary | ICD-10-CM | POA: Diagnosis not present

## 2022-05-29 ENCOUNTER — Other Ambulatory Visit: Payer: Self-pay

## 2022-05-29 MED ORDER — CEPHALEXIN 500 MG PO CAPS
500.0000 mg | ORAL_CAPSULE | ORAL | 0 refills | Status: DC
  Filled 2022-05-29: qty 40, 10d supply, fill #0

## 2022-07-05 DIAGNOSIS — L570 Actinic keratosis: Secondary | ICD-10-CM | POA: Diagnosis not present

## 2022-07-05 DIAGNOSIS — X32XXXA Exposure to sunlight, initial encounter: Secondary | ICD-10-CM | POA: Diagnosis not present

## 2022-09-19 ENCOUNTER — Other Ambulatory Visit: Payer: Self-pay | Admitting: Internal Medicine

## 2022-09-19 DIAGNOSIS — Z1231 Encounter for screening mammogram for malignant neoplasm of breast: Secondary | ICD-10-CM

## 2022-10-11 DIAGNOSIS — D2272 Melanocytic nevi of left lower limb, including hip: Secondary | ICD-10-CM | POA: Diagnosis not present

## 2022-10-11 DIAGNOSIS — D2261 Melanocytic nevi of right upper limb, including shoulder: Secondary | ICD-10-CM | POA: Diagnosis not present

## 2022-10-11 DIAGNOSIS — L570 Actinic keratosis: Secondary | ICD-10-CM | POA: Diagnosis not present

## 2022-10-11 DIAGNOSIS — Z85828 Personal history of other malignant neoplasm of skin: Secondary | ICD-10-CM | POA: Diagnosis not present

## 2022-10-11 DIAGNOSIS — D225 Melanocytic nevi of trunk: Secondary | ICD-10-CM | POA: Diagnosis not present

## 2022-10-18 DIAGNOSIS — Z114 Encounter for screening for human immunodeficiency virus [HIV]: Secondary | ICD-10-CM | POA: Diagnosis not present

## 2022-10-18 DIAGNOSIS — E7800 Pure hypercholesterolemia, unspecified: Secondary | ICD-10-CM | POA: Diagnosis not present

## 2022-10-18 DIAGNOSIS — D519 Vitamin B12 deficiency anemia, unspecified: Secondary | ICD-10-CM | POA: Diagnosis not present

## 2022-10-25 DIAGNOSIS — N200 Calculus of kidney: Secondary | ICD-10-CM | POA: Diagnosis not present

## 2022-10-25 DIAGNOSIS — Z0000 Encounter for general adult medical examination without abnormal findings: Secondary | ICD-10-CM | POA: Diagnosis not present

## 2022-10-25 DIAGNOSIS — R002 Palpitations: Secondary | ICD-10-CM | POA: Diagnosis not present

## 2022-10-25 DIAGNOSIS — D519 Vitamin B12 deficiency anemia, unspecified: Secondary | ICD-10-CM | POA: Diagnosis not present

## 2022-12-11 ENCOUNTER — Ambulatory Visit
Admission: RE | Admit: 2022-12-11 | Discharge: 2022-12-11 | Disposition: A | Discharge status: Home / Self Care | Payer: Commercial Managed Care - PPO | Source: Ambulatory Visit | Attending: Internal Medicine | Admitting: Internal Medicine

## 2022-12-11 ENCOUNTER — Other Ambulatory Visit: Payer: Self-pay

## 2022-12-11 DIAGNOSIS — Z1231 Encounter for screening mammogram for malignant neoplasm of breast: Secondary | ICD-10-CM | POA: Insufficient documentation

## 2022-12-11 MED ORDER — TOBRAMYCIN 0.3 % OP SOLN
2.0000 [drp] | OPHTHALMIC | 0 refills | Status: DC
  Filled 2022-12-11: qty 5, 9d supply, fill #0

## 2023-03-12 DIAGNOSIS — H524 Presbyopia: Secondary | ICD-10-CM | POA: Diagnosis not present

## 2023-03-12 DIAGNOSIS — H52223 Regular astigmatism, bilateral: Secondary | ICD-10-CM | POA: Diagnosis not present

## 2023-03-12 DIAGNOSIS — H5203 Hypermetropia, bilateral: Secondary | ICD-10-CM | POA: Diagnosis not present

## 2023-04-24 DIAGNOSIS — M25551 Pain in right hip: Secondary | ICD-10-CM | POA: Diagnosis not present

## 2023-04-24 DIAGNOSIS — M79602 Pain in left arm: Secondary | ICD-10-CM | POA: Diagnosis not present

## 2023-09-21 DIAGNOSIS — Z01419 Encounter for gynecological examination (general) (routine) without abnormal findings: Secondary | ICD-10-CM | POA: Diagnosis not present

## 2023-09-21 DIAGNOSIS — Z1231 Encounter for screening mammogram for malignant neoplasm of breast: Secondary | ICD-10-CM | POA: Diagnosis not present

## 2023-09-24 ENCOUNTER — Other Ambulatory Visit: Payer: Self-pay | Admitting: Obstetrics and Gynecology

## 2023-09-24 DIAGNOSIS — Z1231 Encounter for screening mammogram for malignant neoplasm of breast: Secondary | ICD-10-CM

## 2023-10-06 DIAGNOSIS — I4891 Unspecified atrial fibrillation: Secondary | ICD-10-CM | POA: Diagnosis not present

## 2023-10-06 DIAGNOSIS — R0602 Shortness of breath: Secondary | ICD-10-CM | POA: Diagnosis not present

## 2023-10-07 DIAGNOSIS — I480 Paroxysmal atrial fibrillation: Secondary | ICD-10-CM | POA: Diagnosis not present

## 2023-10-07 DIAGNOSIS — I4891 Unspecified atrial fibrillation: Secondary | ICD-10-CM | POA: Diagnosis not present

## 2023-10-10 ENCOUNTER — Ambulatory Visit: Attending: Internal Medicine | Admitting: Internal Medicine

## 2023-10-10 ENCOUNTER — Encounter: Payer: Self-pay | Admitting: Internal Medicine

## 2023-10-10 VITALS — BP 102/62 | HR 54 | Ht 64.0 in | Wt 109.6 lb

## 2023-10-10 DIAGNOSIS — I480 Paroxysmal atrial fibrillation: Secondary | ICD-10-CM | POA: Diagnosis not present

## 2023-10-10 DIAGNOSIS — I4891 Unspecified atrial fibrillation: Secondary | ICD-10-CM

## 2023-10-10 NOTE — Patient Instructions (Signed)
 Medication Instructions:  Your physician recommends the following medication changes.  STOP TAKING: Eliquis  Metoprolol Fish oil  *If you need a refill on your cardiac medications before your next appointment, please call your pharmacy*  Lab Work: No labs ordered today  If you have labs (blood work) drawn today and your tests are completely normal, you will receive your results only by: MyChart Message (if you have MyChart) OR A paper copy in the mail If you have any lab test that is abnormal or we need to change your treatment, we will call you to review the results.  Testing/Procedures: No test ordered today   Follow-Up: At [facility], you and your health needs are our priority.  As part of our continuing mission to provide you with exceptional heart care, our providers are all part of one team.  This team includes your primary Cardiologist (physician) and Advanced Practice Providers or APPs (Physician Assistants and Nurse Practitioners) who all work together to provide you with the care you need, when you need it.  Your next appointment:   3 month(s)  Provider:   You may see Sammy Crisp, MD or one of the following Advanced Practice Providers on your designated Care Team:   Laneta Pintos, NP Gildardo Labrador, PA-C Varney Gentleman, PA-C Cadence [place], PA-C Ronald Cockayne, NP Morey Ar, NP

## 2023-10-10 NOTE — Progress Notes (Unsigned)
 Cardiology Office Note:  .   Date:  10/11/2023  ID:  Lindsey Sullivan, DOB [birth year], MRN [id number] PCP: Melchor Spoon, MD  [facility] Providers Cardiologist:  Sammy Crisp, MD     History of Present Illness: .   [name] is a 63 y.o. female with history of vitamin B12 deficiency who presents for evaluation of recent onset of atrial fibrillation.  She was at the beach last weekend and noticed palpitations and persistently elevated heart rates after going for a walk with her niece.  She checked her pulse and noted it to be around 160 bpm.  She also felt a little short of breath when walking.  She is typically very active and had run 6 miles the day before without any issues.  She consumed 2 beers the night before as well; there were no recent medication changes or other obvious precipitants for her elevated heart rates.  She presented to [facility] ED, where was found to be in atrial fibrillation with rapid ventricular response.  Lab workup, including electrolytes, HS-TnI, d-dimer, and TSH, was unremarkable.  She was initially given IV metoprolol and subsequently placed on a diltiazem infusion.  She converted spontaneously to sinus rhythm around 3 AM the next day.  She was discharged on apixaban and metoprolol succinate.  Since returning home, [name] has felt okay, though she has not resumed her regular running regimen.  She denies having any chest pain, lightheadedness, or edema.  She drinks caffeinated sodas from time to time but does not drink coffee or energy drinks.  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  Her niece told her that she snored the night before the onset of atrial fibrillation, though she does not believe that she snores regularly.  ROS: See HPI  Studies Reviewed: Aaron Aas   EKG Interpretation Date/Time:  Wednesday October 10 2023 10:18:03 EDT Ventricular Rate:  54 PR Interval:  154 QRS Duration:  68 QT  Interval:  420 QTC Calculation: 398 R Axis:   21  Text Interpretation: Sinus bradycardia Possible Septal infarct versus lead placement No previous ECGs available Confirmed by Raigen Jagielski ([id number]) on 10/11/2023 9:38:26 AM    TTE [facility], 10/07/2023): Normal LV size and wall thickness.  LVEF 60-65% with normal diastolic function.  Normal RV size and function.  Borderline elevated pulmonic valve gradient.  Otherwise, no significant valvular abnormality. Risk Assessment/Calculations:    CHA2DS2-VASc Score = 1   This indicates a 0.6% annual risk of stroke. The patient's score is based upon: CHF History: 0 HTN History: 0 Diabetes History: 0 Stroke History: 0 Vascular Disease History: 0 Age Score: 0 Gender Score: 1             Physical Exam:   VS:  BP 102/62   Pulse (!) 54   Ht 5\' 4"  (1.626 m)   Wt 109 lb 9.6 oz (49.7 kg)   SpO2 99%   BMI 18.81 kg/m    Wt Readings from Last 3 Encounters:  10/10/23 109 lb 9.6 oz (49.7 kg)  01/30/22 108 lb 3.6 oz (49.1 kg)  04/28/21 110 lb (49.9 kg)    General:  NAD. Neck: No JVD or HJR. Lungs: Clear to auscultation bilaterally without wheezes or crackles. Heart: Bradycardic but regular without murmurs, rubs, or gallops. Abdomen: Soft, nontender, nondistended. Extremities: No lower extremity edema.  ASSESSMENT AND PLAN: .    Paroxysmal atrial  fibrillation: [name] presents for evaluation of new-onset atrial fibrillation that occurred while vacationing at the beach.  She was admitted overnight at [facility] and converted to sinus rhythm within 20 hours of symptom onset.  Workup was unrevealing other than borderline elevated pulmonic valve velocity/gradient, which is unlikely to have contributed to her a-fib with otherwise structurally normal heart by echo.  She was discharged on apixaban and metoprolol succinate but is not keen on continuing these medications.  We discussed the risks and benefits of continued anticoagulation in the setting of her  low CHADS2-VASc score (1 - gender) and have agreed to discontinue apixaban.  With her low resting heart rate at baseline, we will also stop metoprolol.  I advised her to minimize her alcohol consumption as well as to stop her omega-3 supplement, given concerns that this could be a risk factor for a-fib.  We also discussed pursuing a sleep study with concern for snoring the night before onset of a-fib but have agreed to defer this in the setting of a STOP-Bang score of 1-2 (age +/- snoring).  I asked Ms. Ronda to wear her Apple watch regularly, as this can help monitor for asymptomatic a-fib.  If she were to have recurrent PAF, EP consultation will be pursued.    Dispo: Return to clinic in 3 months.  Signed, Sammy Crisp, MD

## 2023-10-11 ENCOUNTER — Encounter: Payer: Self-pay | Admitting: Internal Medicine

## 2023-10-11 DIAGNOSIS — I480 Paroxysmal atrial fibrillation: Secondary | ICD-10-CM | POA: Insufficient documentation

## 2023-10-17 DIAGNOSIS — D2262 Melanocytic nevi of left upper limb, including shoulder: Secondary | ICD-10-CM | POA: Diagnosis not present

## 2023-10-17 DIAGNOSIS — D2271 Melanocytic nevi of right lower limb, including hip: Secondary | ICD-10-CM | POA: Diagnosis not present

## 2023-10-17 DIAGNOSIS — D2272 Melanocytic nevi of left lower limb, including hip: Secondary | ICD-10-CM | POA: Diagnosis not present

## 2023-10-17 DIAGNOSIS — L570 Actinic keratosis: Secondary | ICD-10-CM | POA: Diagnosis not present

## 2023-10-17 DIAGNOSIS — D2261 Melanocytic nevi of right upper limb, including shoulder: Secondary | ICD-10-CM | POA: Diagnosis not present

## 2023-10-17 DIAGNOSIS — D225 Melanocytic nevi of trunk: Secondary | ICD-10-CM | POA: Diagnosis not present

## 2023-10-17 DIAGNOSIS — Z85828 Personal history of other malignant neoplasm of skin: Secondary | ICD-10-CM | POA: Diagnosis not present

## 2023-10-24 DIAGNOSIS — R002 Palpitations: Secondary | ICD-10-CM | POA: Diagnosis not present

## 2023-10-24 DIAGNOSIS — Z0000 Encounter for general adult medical examination without abnormal findings: Secondary | ICD-10-CM | POA: Diagnosis not present

## 2023-10-24 DIAGNOSIS — D519 Vitamin B12 deficiency anemia, unspecified: Secondary | ICD-10-CM | POA: Diagnosis not present

## 2023-10-24 DIAGNOSIS — Z79899 Other long term (current) drug therapy: Secondary | ICD-10-CM | POA: Diagnosis not present

## 2023-10-31 DIAGNOSIS — R002 Palpitations: Secondary | ICD-10-CM | POA: Diagnosis not present

## 2023-10-31 DIAGNOSIS — N200 Calculus of kidney: Secondary | ICD-10-CM | POA: Diagnosis not present

## 2023-10-31 DIAGNOSIS — I480 Paroxysmal atrial fibrillation: Secondary | ICD-10-CM | POA: Diagnosis not present

## 2023-10-31 DIAGNOSIS — Z9189 Other specified personal risk factors, not elsewhere classified: Secondary | ICD-10-CM | POA: Diagnosis not present

## 2023-10-31 DIAGNOSIS — Z0000 Encounter for general adult medical examination without abnormal findings: Secondary | ICD-10-CM | POA: Diagnosis not present

## 2023-10-31 DIAGNOSIS — D519 Vitamin B12 deficiency anemia, unspecified: Secondary | ICD-10-CM | POA: Diagnosis not present

## 2023-10-31 DIAGNOSIS — Z131 Encounter for screening for diabetes mellitus: Secondary | ICD-10-CM | POA: Diagnosis not present

## 2023-11-25 IMAGING — MG MM DIGITAL SCREENING BILAT W/ TOMO AND CAD
8 series · 9 of 24 positions shown · non-contrast
Comparison: Previous exam(s).

CLINICAL DATA: Screening.

EXAM:
DIGITAL SCREENING BILATERAL MAMMOGRAM WITH TOMOSYNTHESIS AND CAD
TECHNIQUE: Bilateral screening digital craniocaudal and mediolateral oblique
mammograms were obtained. Bilateral screening digital breast
tomosynthesis was performed. The images were evaluated with
computer-aided detection.

[R CC synth-2D]
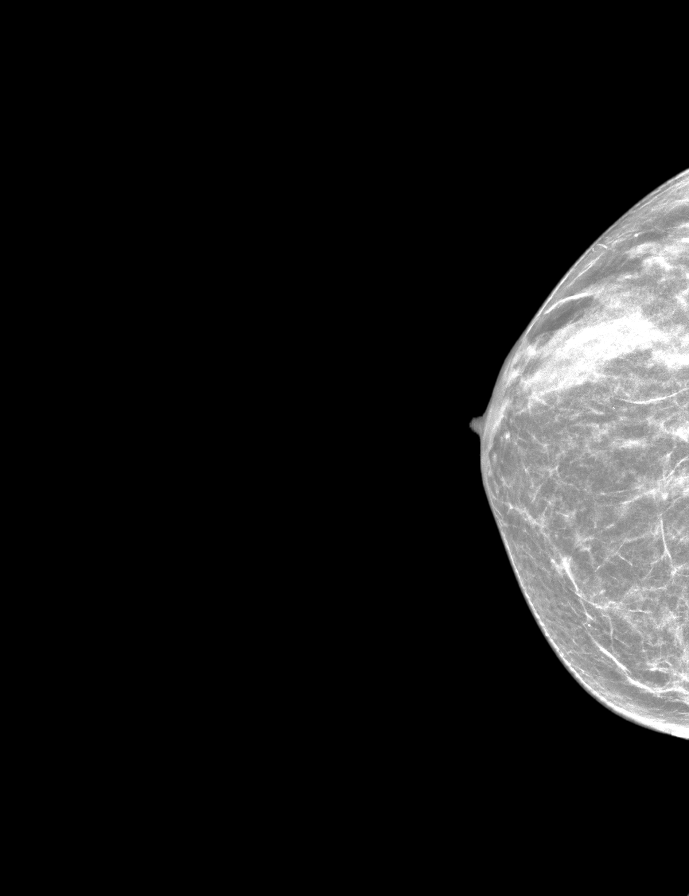

[L CC synth-2D]
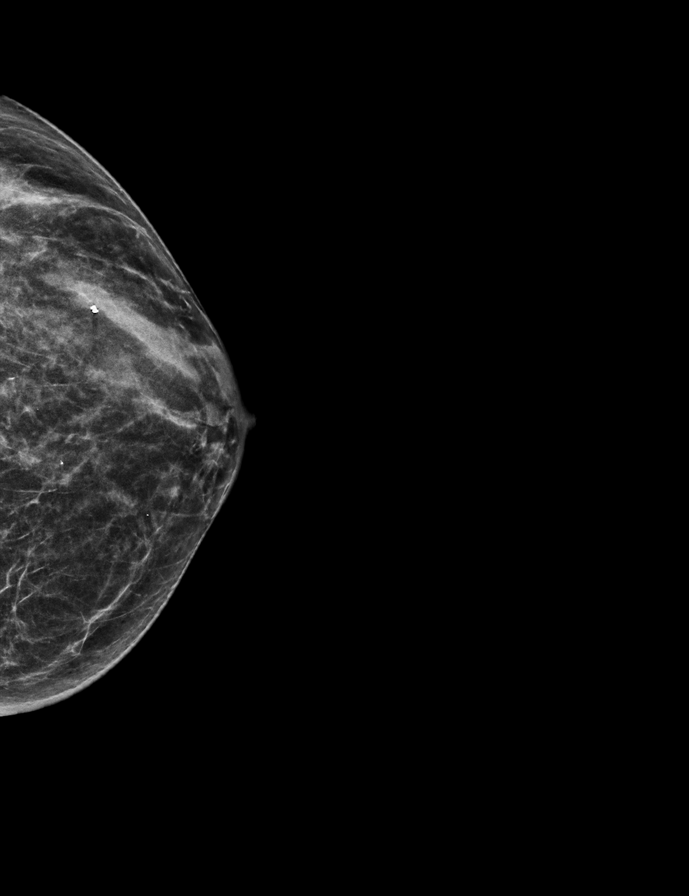

[L MLO synth-2D]
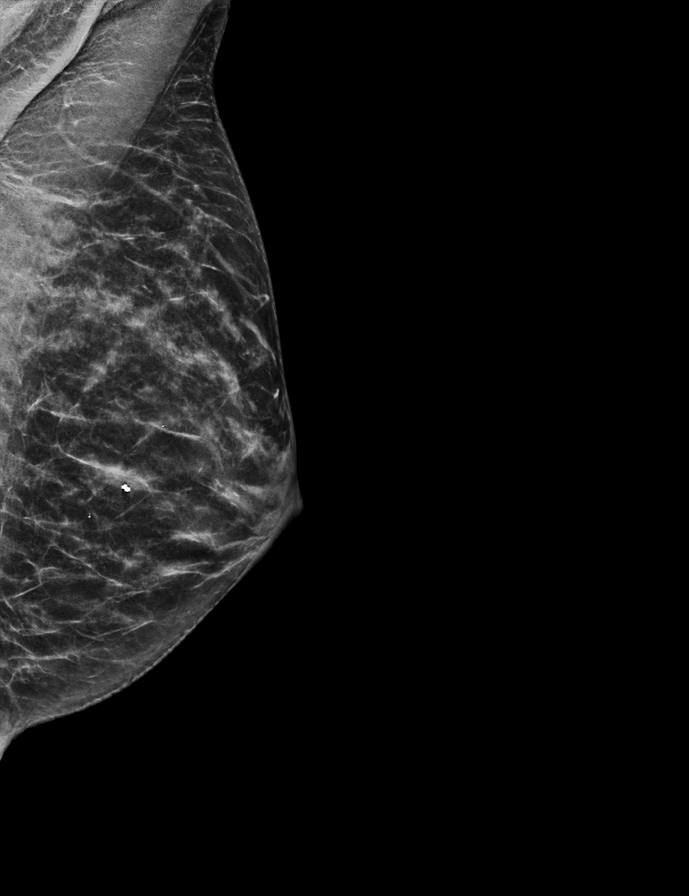

[R MLO synth-2D]
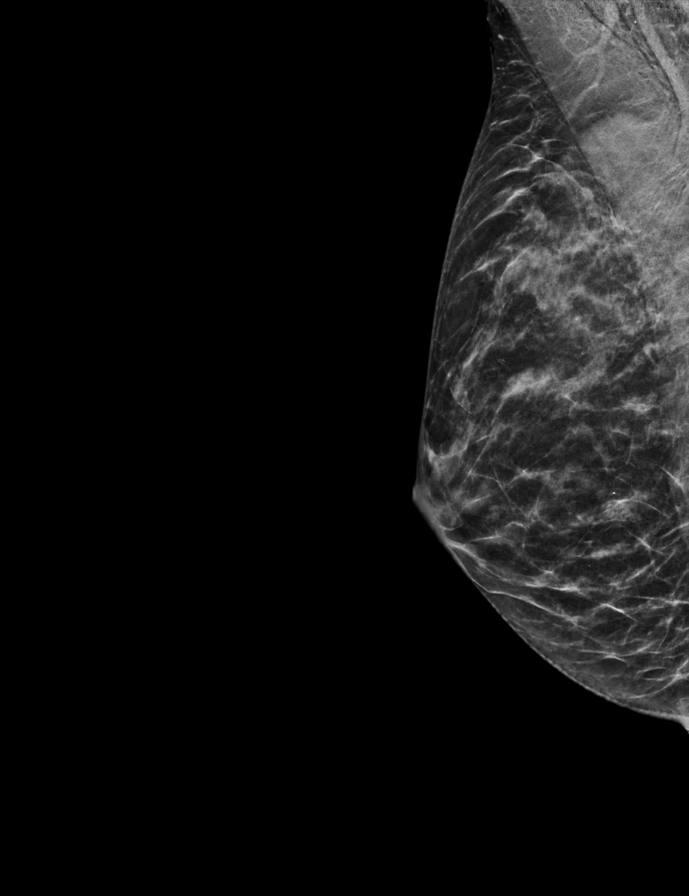

[R MLO tomo · 2 of 46 frames shown]
[frame 15/46]
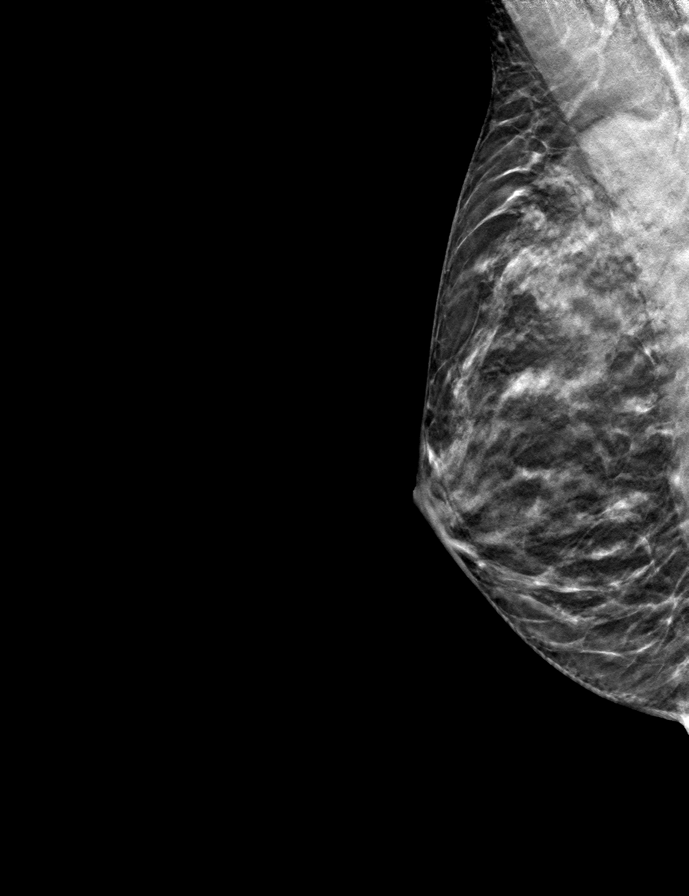
[frame 23/46]
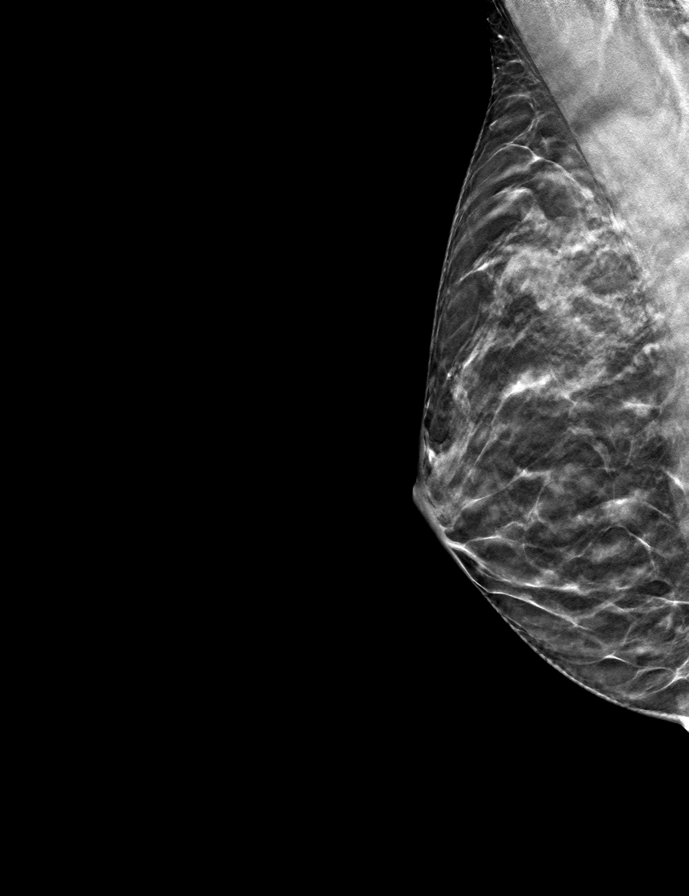

[L CC tomo · tomo slice 25/49.0]
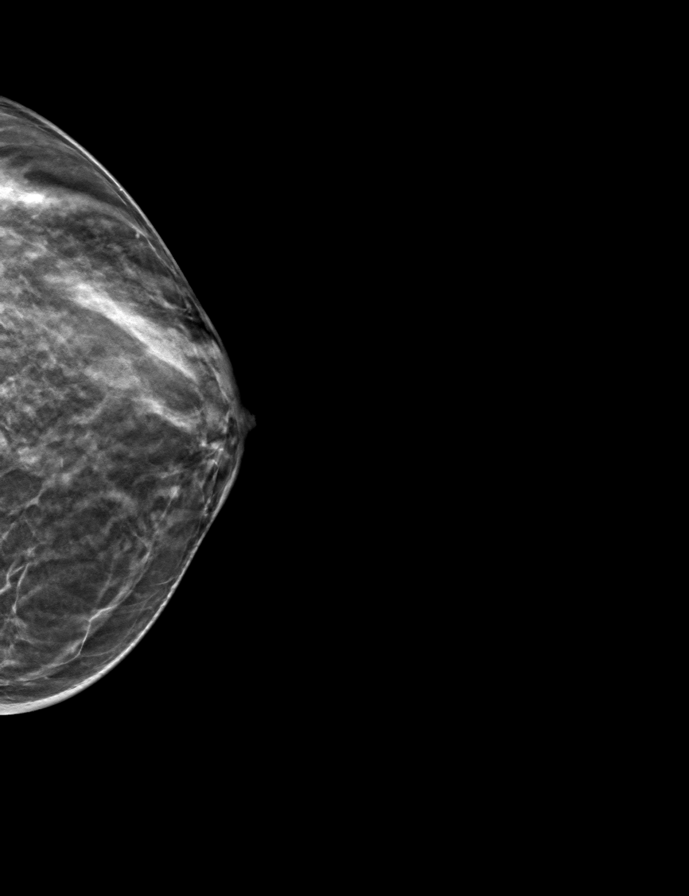

[L MLO tomo · tomo slice 25/49.0]
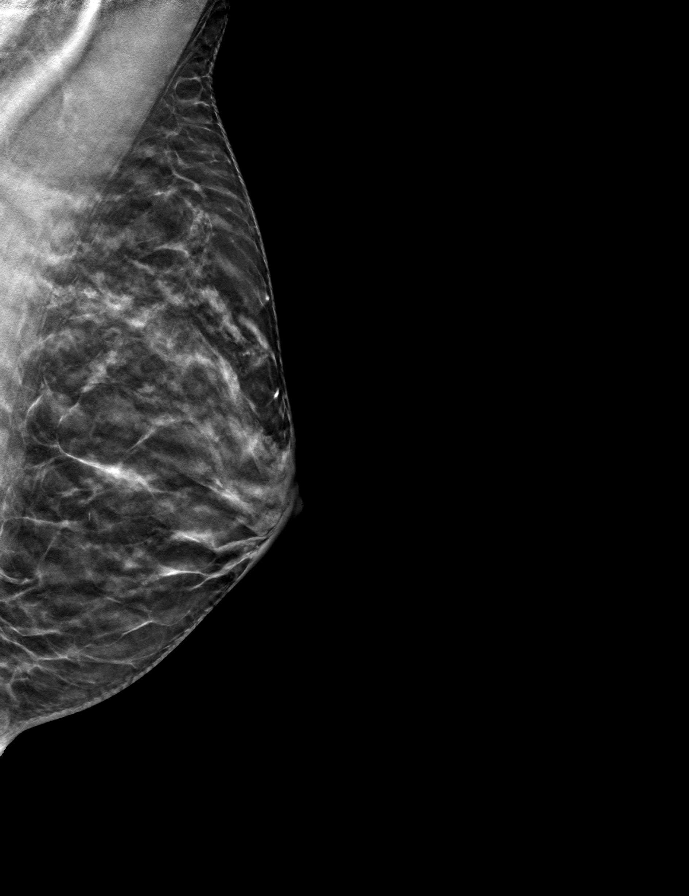

[R CC tomo · tomo slice 23/46.0]
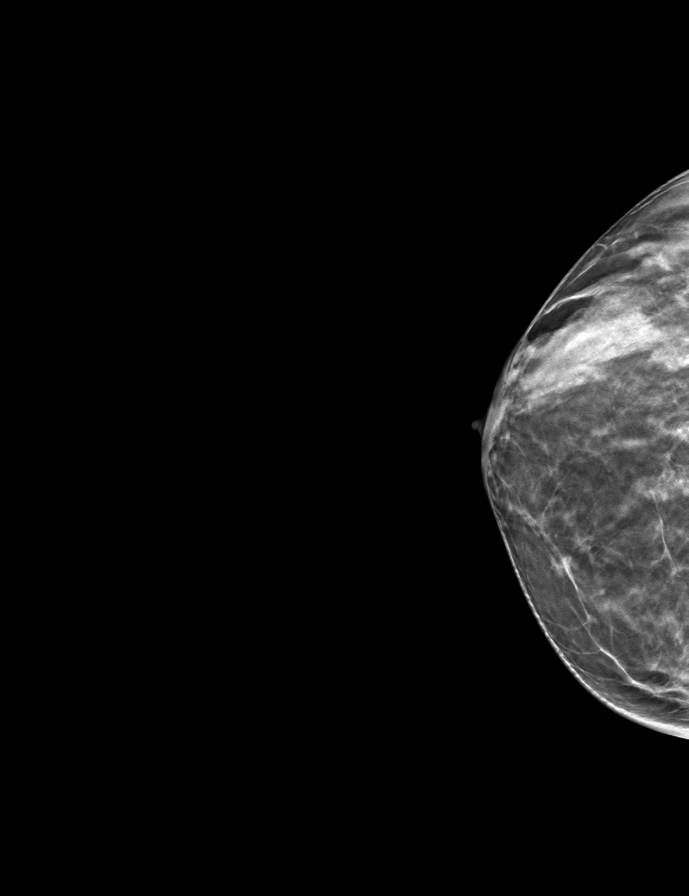

[9 of 24 positions shown; findings below may reference images not displayed]

ACR Breast Density Category c: The breast tissue is heterogeneously
dense, which may obscure small masses.
FINDINGS: There are no findings suspicious for malignancy.
IMPRESSION: No mammographic evidence of malignancy. A result letter of this
screening mammogram will be mailed directly to the patient.

RECOMMENDATION:
Screening mammogram in one year. (Code:Q3-W-BC3)

BI-RADS CATEGORY  1: Negative.

## 2023-12-12 ENCOUNTER — Ambulatory Visit
Admission: RE | Admit: 2023-12-12 | Discharge: 2023-12-12 | Disposition: A | Discharge status: Home / Self Care | Source: Ambulatory Visit | Attending: Obstetrics and Gynecology | Admitting: Obstetrics and Gynecology

## 2023-12-12 DIAGNOSIS — Z1231 Encounter for screening mammogram for malignant neoplasm of breast: Secondary | ICD-10-CM | POA: Insufficient documentation

## 2024-01-10 ENCOUNTER — Ambulatory Visit: Attending: Internal Medicine | Admitting: Internal Medicine

## 2024-01-10 ENCOUNTER — Encounter: Payer: Self-pay | Admitting: Internal Medicine

## 2024-01-10 VITALS — BP 100/58 | HR 53 | Ht 63.0 in | Wt 107.6 lb

## 2024-01-10 DIAGNOSIS — I480 Paroxysmal atrial fibrillation: Secondary | ICD-10-CM | POA: Diagnosis not present

## 2024-01-10 NOTE — Patient Instructions (Signed)

## 2024-01-10 NOTE — Progress Notes (Signed)
 Cardiology Office Note:  .   Date:  01/10/2024  ID:  Lindsey Sullivan, DOB [birth year], MRN [id number] PCP: Lindsey Ophelia JINNY DOUGLAS, MD  [facility] Providers Cardiologist:  Lonni Hanson, MD     History of Present Illness: .   Lindsey Sullivan is a 63 y.o. female with history of paroxysmal atrial fibrillation and vitamin B12 deficiency, who presents for follow-up of atrial fibrillation.  I met her in April for follow-up of an episode of new onset atrial fibrillation while at the beach.  She had sudden onset of shortness of breath with minimal activity that led her to go to [facility] where she was found to be in atrial fibrillation with rapid ventricular response.  She was placed on metoprolol and diltiazem, subsequently converting to sinus rhythm.  She was discharged on metoprolol and apixaban.  She was feeling well at the time of our follow-up visit.  We agreed to stop metoprolol and apixaban given her CHA2DS2-VASc score of 1.  I also recommended cessation of fish oil and minimization of alcohol consumption.  Today, Lindsey Sullivan reports that she has been feeling well.  She has not had any further episodes of shortness of breath or fatigue like what she experienced with her atrial fibrillation this spring.  She also denies chest pain, palpitations, lightheadedness, and edema.  She continues to run regularly without difficulty.  She notes that she saw Dr. Fernande after our last visit, who recommended initiation of aspirin 81 mg daily.  She took this for a few days but ultimately stopped it due to easy bruising.  She remains off fish oil and has also been minimizing her alcohol consumption.  ROS: See HPI  Studies Reviewed: SABRA   EKG Interpretation Date/Time:  Thursday January 10 2024 08:56:55 EDT Ventricular Rate:  53 PR Interval:  158 QRS Duration:  74 QT Interval:  436 QTC Calculation: 409 R Axis:   41  Text Interpretation: Sinus bradycardia Normal ECG When compared with ECG  of 10-Oct-2023 10:18, Criteria for Septal infarct are no longer Present Confirmed by Lindsey Sullivan [phone]) on 01/10/2024 9:02:15 AM    Echocardiogram [facility], 10/07/2023): Normal LV size and wall thickness. LVEF 60-65% with normal diastolic function. Normal RV size and function. Borderline elevated pulmonic valve gradient. Otherwise, no significant valvular abnormality.   Risk Assessment/Calculations:    CHA2DS2-VASc Score = 1   This indicates a 0.6% annual risk of stroke. The patient's score is based upon: CHF History: 0 HTN History: 0 Diabetes History: 0 Stroke History: 0 Vascular Disease History: 0 Age Score: 0 Gender Score: 1  Physical Exam:   VS:  BP (!) 100/58 (BP Location: Left Arm, Patient Position: Sitting, Cuff Size: Normal)   Pulse (!) 53   Ht 5' 3 (1.6 m)   Wt 107 lb 9.6 oz (48.8 kg)   SpO2 99%   BMI 19.06 kg/m    Wt Readings from Last 3 Encounters:  01/10/24 107 lb 9.6 oz (48.8 kg)  10/10/23 109 lb 9.6 oz (49.7 kg)  01/30/22 108 lb 3.6 oz (49.1 kg)    General:  NAD. Neck: No JVD or HJR. Lungs: Clear to auscultation bilaterally without wheezes or crackles. Heart: Bradycardic but regular without murmurs. Abdomen: Soft, nontender, nondistended. Extremities: No lower extremity edema.  ASSESSMENT AND PLAN: .    Paroxysmal atrial fibrillation Lindsey Sullivan has not had any further episodes to suggest recurrent atrial fibrillation.  She still has some previously prescribed  metoprolol tartrate on hand to use as needed if she were to develop symptoms again.  Given that her resting heart rate remains borderline, I do not think that standing beta-blocker therapy would be good for her.  We will also defer anticoagulation in the setting of a CHA2DS2-VASc score of 1 (gender).  We also discussed the risks and benefits of low-dose aspirin; we will not resume aspirin given easy bruising that Lindsey Sullivan experienced with it as well as limited clinical benefit in the setting of atrial  fibrillation.  I asked her to keep monitoring her rhythm with her Apple Watch and to minimize her alcohol intake.      Dispo: Return to clinic in 1 year.  Signed, Lonni Hanson, MD

## 2024-03-13 DIAGNOSIS — H52223 Regular astigmatism, bilateral: Secondary | ICD-10-CM | POA: Diagnosis not present

## 2024-03-13 DIAGNOSIS — H5203 Hypermetropia, bilateral: Secondary | ICD-10-CM | POA: Diagnosis not present

## 2024-03-13 DIAGNOSIS — H524 Presbyopia: Secondary | ICD-10-CM | POA: Diagnosis not present

## 2024-03-17 ENCOUNTER — Other Ambulatory Visit: Payer: Self-pay

## 2024-03-17 MED ORDER — TOBRAMYCIN-DEXAMETHASONE 0.3-0.1 % OP SUSP
2.0000 [drp] | Freq: Four times a day (QID) | OPHTHALMIC | 0 refills | Status: AC
  Filled 2024-03-17: qty 5, 13d supply, fill #0
